# Patient Record
Sex: Male | Born: 1995 | Race: Black or African American | Hispanic: No | Marital: Single | Smoking: Current every day smoker
Health system: Southern US, Community
[De-identification: ages and names within clinical notes are randomized; demographics above are authoritative.]

## PROBLEM LIST (undated history)

## (undated) DIAGNOSIS — L509 Urticaria, unspecified: Secondary | ICD-10-CM

## (undated) DIAGNOSIS — L309 Dermatitis, unspecified: Secondary | ICD-10-CM

## (undated) DIAGNOSIS — J45909 Unspecified asthma, uncomplicated: Secondary | ICD-10-CM

## (undated) HISTORY — DX: Dermatitis, unspecified: L30.9

## (undated) HISTORY — DX: Urticaria, unspecified: L50.9

---
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
## 2013-11-02 ENCOUNTER — Emergency Department (HOSPITAL_COMMUNITY)
Admission: EM | Admit: 2013-11-02 | Discharge: 2013-11-03 | Disposition: A | Discharge status: Home / Self Care | Payer: Medicaid Other | Attending: Emergency Medicine | Admitting: Emergency Medicine

## 2013-11-02 ENCOUNTER — Encounter (HOSPITAL_COMMUNITY): Payer: Self-pay | Admitting: Emergency Medicine

## 2013-11-02 ENCOUNTER — Emergency Department (HOSPITAL_COMMUNITY): Payer: Medicaid Other

## 2013-11-02 DIAGNOSIS — R079 Chest pain, unspecified: Secondary | ICD-10-CM | POA: Insufficient documentation

## 2013-11-02 DIAGNOSIS — M546 Pain in thoracic spine: Secondary | ICD-10-CM | POA: Diagnosis not present

## 2013-11-02 DIAGNOSIS — R059 Cough, unspecified: Secondary | ICD-10-CM | POA: Diagnosis not present

## 2013-11-02 DIAGNOSIS — F17200 Nicotine dependence, unspecified, uncomplicated: Secondary | ICD-10-CM | POA: Diagnosis not present

## 2013-11-02 DIAGNOSIS — Z880 Allergy status to penicillin: Secondary | ICD-10-CM | POA: Diagnosis not present

## 2013-11-02 DIAGNOSIS — R05 Cough: Secondary | ICD-10-CM | POA: Insufficient documentation

## 2013-11-02 DIAGNOSIS — R110 Nausea: Secondary | ICD-10-CM | POA: Insufficient documentation

## 2013-11-02 LAB — BASIC METABOLIC PANEL
Anion gap: 13 (ref 5–15)
BUN: 16 mg/dL (ref 6–23)
CO2: 27 mEq/L (ref 19–32)
Calcium: 9.9 mg/dL (ref 8.4–10.5)
Chloride: 103 mEq/L (ref 96–112)
Creatinine, Ser: 1.19 mg/dL (ref 0.50–1.35)
GFR calc Af Amer: >90 mL/min (ref >90)
GFR calc non Af Amer: 87 mL/min — ABNORMAL LOW (ref >90)
Glucose, Bld: 97 mg/dL (ref 70–99)
Potassium: 4.3 mEq/L (ref 3.7–5.3)
Sodium: 143 mEq/L (ref 137–147)

## 2013-11-02 LAB — CBC
HCT: 42.9 % (ref 39.0–52.0)
Hemoglobin: 15 g/dL (ref 13.0–17.0)
MCH: 29.5 pg (ref 26.0–34.0)
MCHC: 35 g/dL (ref 30.0–36.0)
MCV: 84.3 fL (ref 78.0–100.0)
Platelets: 124 10*3/uL — ABNORMAL LOW (ref 150–400)
RBC: 5.09 MIL/uL (ref 4.22–5.81)
RDW: 13.5 % (ref 11.5–15.5)
WBC: 4.5 10*3/uL (ref 4.0–10.5)

## 2013-11-02 IMAGING — CR DG CHEST 2V
2 series · 2 of 2 positions shown · non-contrast
Comparison: Chest radiograph performed [DATE]

CLINICAL DATA: Left-sided chest pain and shortness of breath. Chest
pain radiates to the mid chest. History of smoking.

EXAM:
CHEST  2 VIEW

[w chest pa]
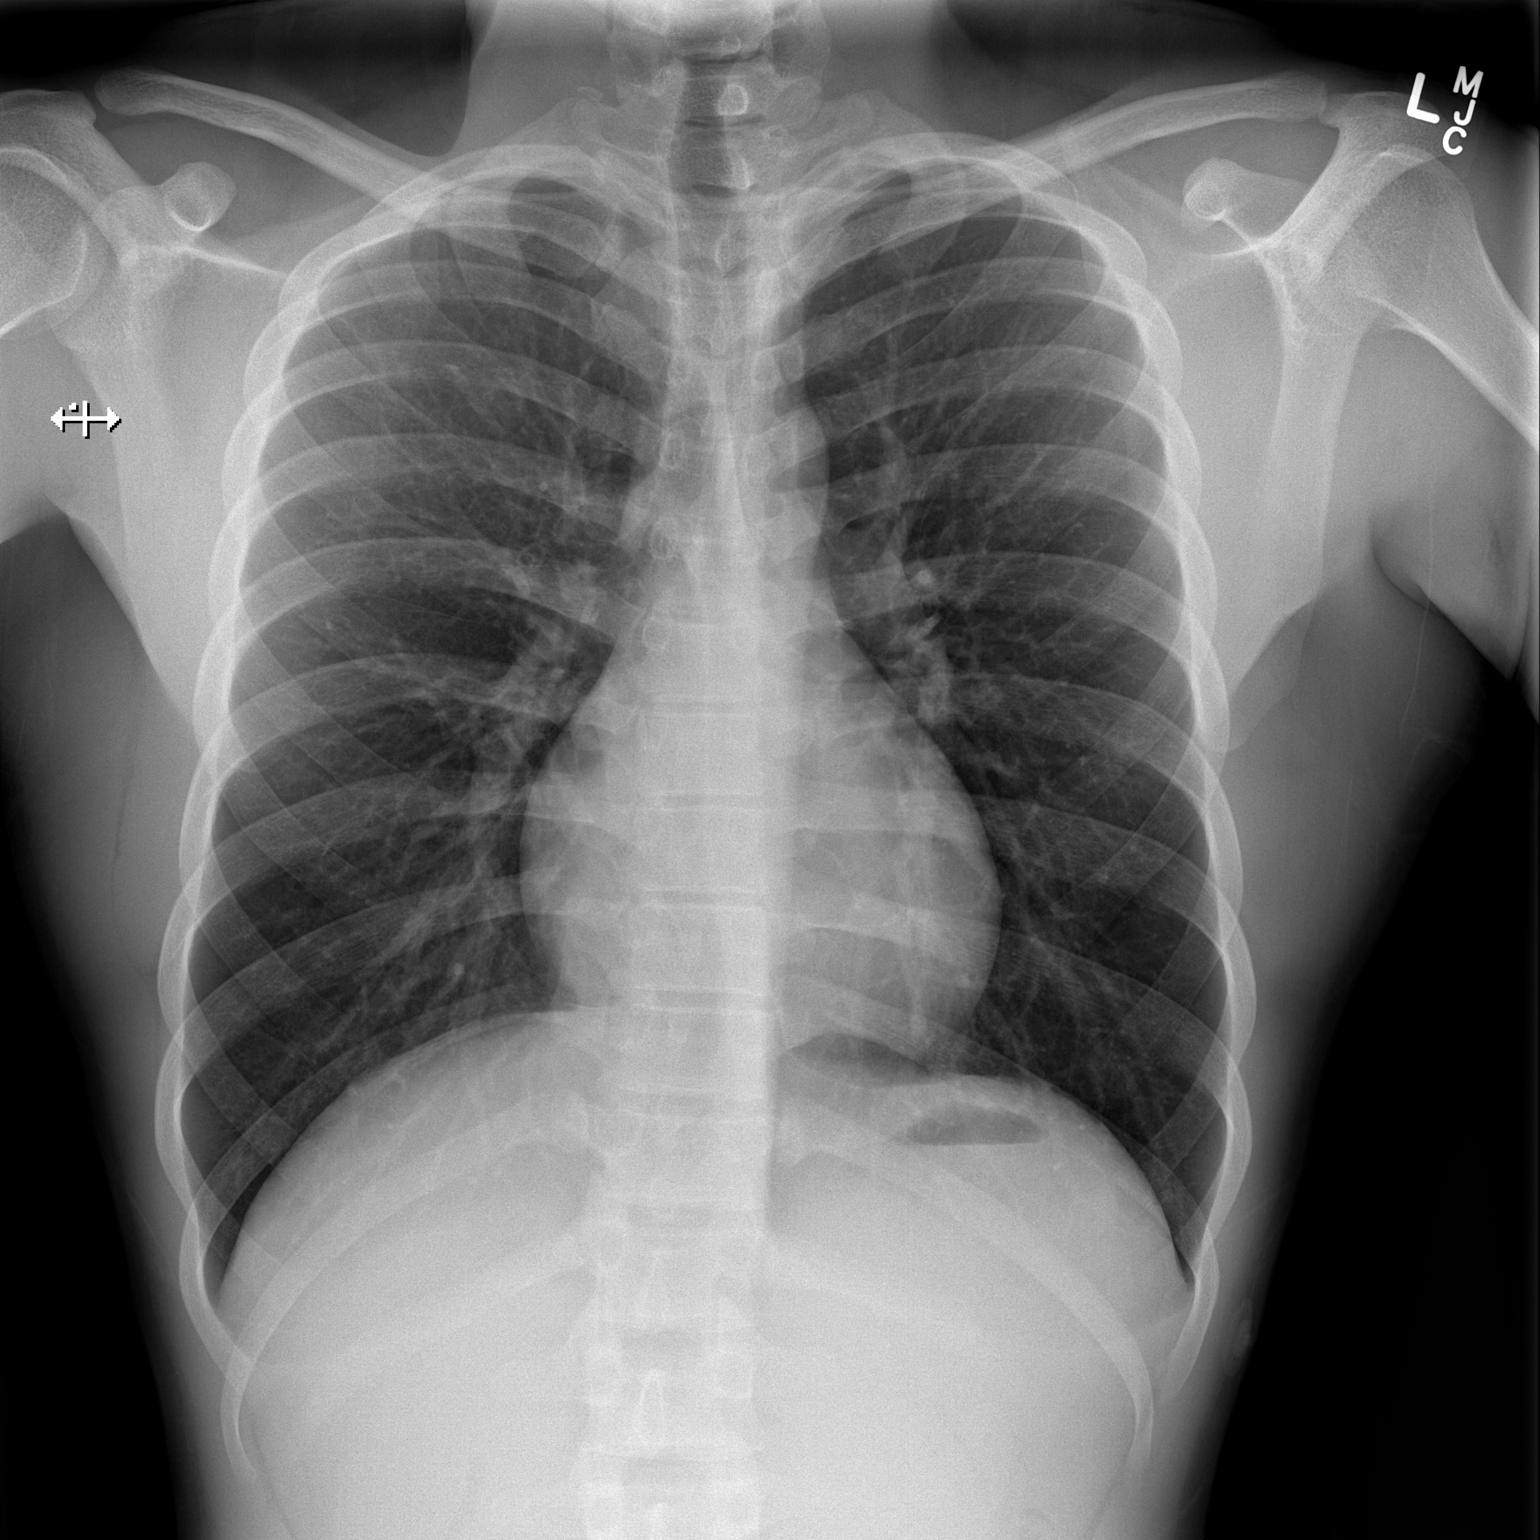

[w chest lat]
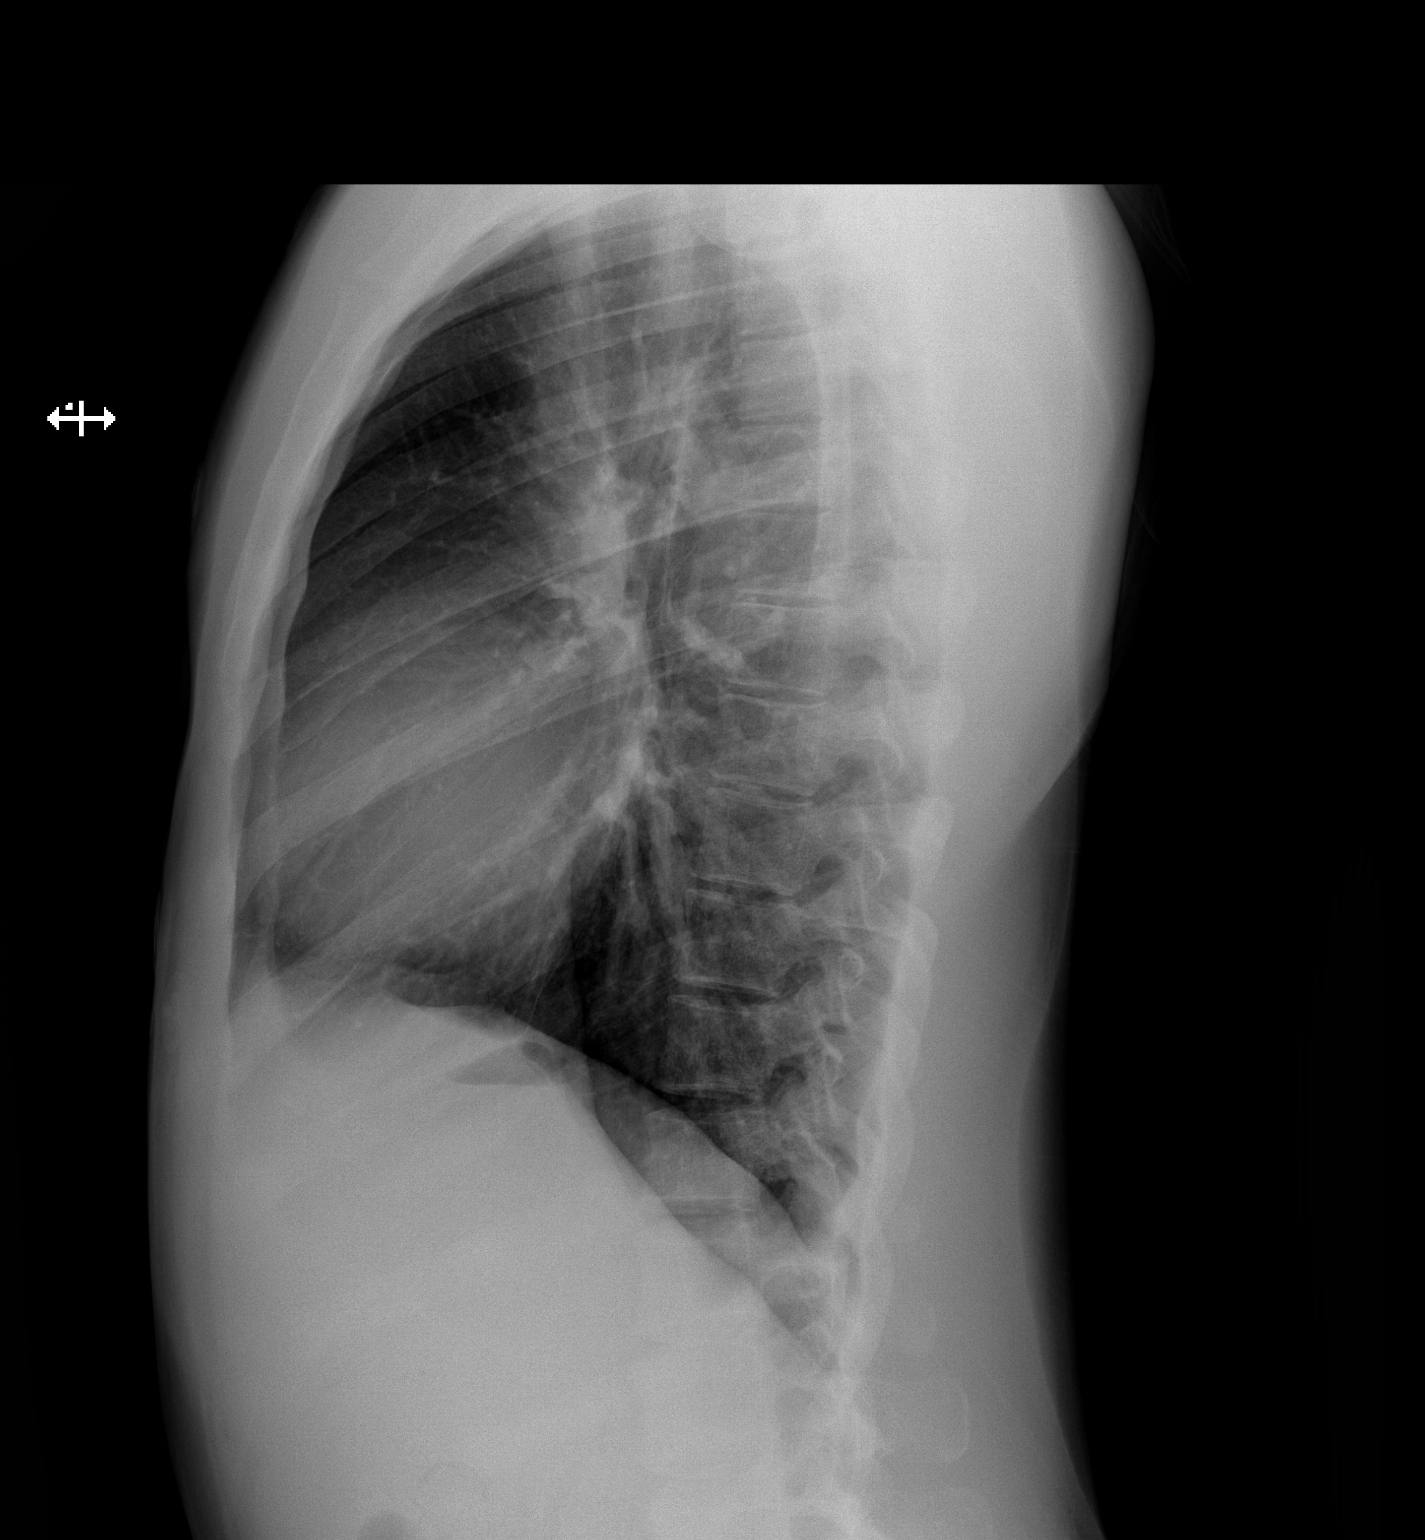

[2 of 2 positions shown; findings below may reference images not displayed]

FINDINGS: The lungs are well-aerated and clear. There is no evidence of focal
opacification, pleural effusion or pneumothorax.

The heart is normal in size; the mediastinal contour is within
normal limits. No acute osseous abnormalities are seen.
IMPRESSION: No acute cardiopulmonary process seen.

## 2013-11-02 MED ORDER — DM-GUAIFENESIN ER 30-600 MG PO TB12
1.0000 | ORAL_TABLET | Freq: Two times a day (BID) | ORAL | Status: DC
  Administered 2013-11-02: 1 via ORAL
  Filled 2013-11-02: qty 1

## 2013-11-02 MED ORDER — ACETAMINOPHEN-CODEINE #3 300-30 MG PO TABS
1.0000 | ORAL_TABLET | Freq: Once | ORAL | Status: AC
Start: 2013-11-02 — End: 2013-11-02
  Administered 2013-11-02: 1 via ORAL
  Filled 2013-11-02: qty 1

## 2013-11-02 NOTE — ED Notes (Signed)
Pt states he has been having left sided chest pain and SOB since Thursday, Pt denies any dizziness or diaphoresis but states he has had n/v.

## 2013-11-02 NOTE — ED Provider Notes (Signed)
CSN: [id number]     Arrival date & time 11/02/13  2154 History   First MD Initiated Contact with Patient 11/02/13 2259     Chief Complaint  Patient presents with  . Shortness of Breath  . Chest Pain     (Consider location/radiation/quality/duration/timing/severity/associated sxs/prior Treatment) HPI Philip Ramos is a 18 y.o. male with no significant past medical history coming in with chest pain for the past 4 days. Patient actually points to his posterior back on the left side. He states it's worse at rest and associated with shortness of breath. He denies any recent trauma, heavy lifting, or reasons for soreness. He has had a viral URI recently. He has persistent cough with congestion. He had one episode of posttussive emesis that was nonbloody. He denies any fevers or diaphoresis. He denies any sick contacts. He has no risk factors for blood clots nor does he have a history of them.  He describes no neuro deficits.    10 Systems reviewed and are negative for acute change except as noted in the HPI.     History reviewed. No pertinent past medical history. History reviewed. No pertinent past surgical history. No family history on file. History  Substance Use Topics  . Smoking status: Current Every Day Smoker  . Smokeless tobacco: Not on file     Comment: smokes black and milds  . Alcohol Use: No    Review of Systems    Allergies  Amoxicillin and Penicillins  Home Medications   Prior to Admission medications   Medication Sig Start Date End Date Taking? Authorizing Provider  ibuprofen (ADVIL,MOTRIN) 200 MG tablet Take 200 mg by mouth every 6 (six) hours as needed for moderate pain.   Yes Historical Provider, MD  Menthol, Topical Analgesic, (ICY HOT ADVANCED RELIEF EX) Apply 1 application topically 3 (three) times daily as needed (pain).   Yes Historical Provider, MD   BP 141/83  Pulse 97  Temp(Src) 97.8 F (36.6 C) (Oral)  Resp 20  Ht  (1.753 m)  Wt 161 lb  (73.029 kg)  BMI 23.76 kg/m2  SpO2 100% Physical Exam  Nursing note and vitals reviewed. Constitutional: He is oriented to person, place, and time. Vital signs are normal. He appears well-developed and well-nourished.  Non-toxic appearance. He does not appear ill. No distress.  HENT:  Head: Normocephalic and atraumatic.  Nose: Nose normal.  Mouth/Throat: Oropharynx is clear and moist. No oropharyngeal exudate.  Eyes: Conjunctivae and EOM are normal. Pupils are equal, round, and reactive to light. No scleral icterus.  Neck: Normal range of motion. Neck supple. No tracheal deviation, no edema, no erythema and normal range of motion present. No mass and no thyromegaly present.  Cardiovascular: Normal rate, regular rhythm, S1 normal, S2 normal, normal heart sounds, intact distal pulses and normal pulses.  Exam reveals no gallop and no friction rub.   No murmur heard. Pulses:      Radial pulses are 2+ on the right side, and 2+ on the left side.       Dorsalis pedis pulses are 2+ on the right side, and 2+ on the left side.  Pulmonary/Chest: Effort normal and breath sounds normal. No respiratory distress. He has no wheezes. He has no rhonchi. He has no rales. He exhibits no tenderness.  Abdominal: Soft. Normal appearance and bowel sounds are normal. He exhibits no distension, no ascites and no mass. There is no hepatosplenomegaly. There is no tenderness. There is no rebound, no guarding  and no CVA tenderness.  Musculoskeletal: Normal range of motion. He exhibits no edema and no tenderness.  Lymphadenopathy:    He has no cervical adenopathy.  Neurological: He is alert and oriented to person, place, and time. He has normal strength. No cranial nerve deficit or sensory deficit. GCS eye subscore is 4. GCS verbal subscore is 5. GCS motor subscore is 6.  Skin: Skin is warm, dry and intact. No petechiae and no rash noted. He is not diaphoretic. No erythema. No pallor.  Psychiatric: He has a normal mood and  affect. His behavior is normal. Judgment normal.    ED Course  Procedures (including critical care time) Labs Review Labs Reviewed  CBC - Abnormal; Notable for the following:    Platelets 124 (*)    All other components within normal limits  BASIC METABOLIC PANEL - Abnormal; Notable for the following:    GFR calc non Af Amer 87 (*)    All other components within normal limits  I-STAT TROPOININ, ED    Imaging Review Dg Chest 2 View  11/02/2013   CLINICAL DATA:  Left-sided chest pain and shortness of breath. Chest pain radiates to the mid chest. History of smoking.  EXAM: CHEST  2 VIEW  COMPARISON:  Chest radiograph performed 01/15/2009  FINDINGS: The lungs are well-aerated and clear. There is no evidence of focal opacification, pleural effusion or pneumothorax.  The heart is normal in size; the mediastinal contour is within normal limits. No acute osseous abnormalities are seen.  IMPRESSION: No acute cardiopulmonary process seen.   Electronically Signed   By: Roanna Raider M.D.   On: 11/02/2013 23:32     EKG Interpretation   Date/Time:  Sunday November 02 2013 22:11:10 EDT Ventricular Rate:  83 PR Interval:  163 QRS Duration: 93 QT Interval:  342 QTC Calculation: 402 R Axis:   81 Text Interpretation:  Sinus rhythm RAE, consider biatrial enlargement No  significant change since last tracing Confirmed by KNAPP  MD-J, JON  ([id number]) on 11/02/2013 10:18:14 PM      MDM   Final diagnoses:  None    This is a 18 year old male coming in complaining of chest pain the setting of viral URI. He states he has had a cough during the interval.  A low concern for ACS as the patient is tone, there is no exertional component, and denies any shortness of breath. EKG does not reveal any significant abnormalities. Patient has perked negative. He took a Motrin at home with no relief. He was given Mucinex and a Tylenol 3 in the emergency department for his congestion and cough.  Upon repeat  assessment patient states his pain is significantly improved, and the cough is decreased. Patient be sent home with a prescription for Tylenol 3 and advised to followup with regular physician within this week. Return precautions were given. Vital signs remain within his normal limits, patient safe for discharge.  Tomasita Crumble, MD 11/03/13 [phone]

## 2013-11-03 MED ORDER — ACETAMINOPHEN-CODEINE #3 300-30 MG PO TABS: 1.0000 | ORAL_TABLET | Freq: Two times a day (BID) | ORAL | Status: DC | PRN

## 2013-11-03 NOTE — Discharge Instructions (Signed)
Back Pain, Adult Philip Ramos, you were seen today for back pain and congestion.  Your labs and chest xray were normal.  Your EKG is unchanged from previous.  Follow up with your regular doctor within 3 days for continued pain.  Return to the ED immediately for any worsening concerns or persistent chest pain. Thank you. Back pain is very common. The pain often gets better over time. The cause of back pain is usually not dangerous. Most people can learn to manage their back pain on their own.  HOME CARE   Stay active. Start with short walks on flat ground if you can. Try to walk farther each day.  Do not sit, drive, or stand in one place for more than 30 minutes. Do not stay in bed.  Do not avoid exercise or work. Activity can help your back heal faster.  Be careful when you bend or lift an object. Bend at your knees, keep the object close to you, and do not twist.  Sleep on a firm mattress. Lie on your side, and bend your knees. If you lie on your back, put a pillow under your knees.  Only take medicines as told by your doctor.  Put ice on the injured area.  Put ice in a plastic bag.  Place a towel between your skin and the bag.  Leave the ice on for 15-20 minutes, 03-04 times a day for the first 2 to 3 days. After that, you can switch between ice and heat packs.  Ask your doctor about back exercises or massage.  Avoid feeling anxious or stressed. Find good ways to deal with stress, such as exercise. GET HELP RIGHT AWAY IF:   Your pain does not go away with rest or medicine.  Your pain does not go away in 1 week.  You have new problems.  You do not feel well.  The pain spreads into your legs.  You cannot control when you poop (bowel movement) or pee (urinate).  Your arms or legs feel weak or lose feeling (numbness).  You feel sick to your stomach (nauseous) or throw up (vomit).  You have belly (abdominal) pain.  You feel like you may pass out (faint). MAKE SURE YOU:    Understand these instructions.  Will watch your condition.  Will get help right away if you are not doing well or get worse. Document Released: 07/26/2007 Document Revised: 05/01/2011 Document Reviewed: 06/10/2013 [facility] Patient Information 2015 [place], [place]. This information is not intended to replace advice given to you by your health care provider. Make sure you discuss any questions you have with your health care provider.

## 2013-11-10 ENCOUNTER — Encounter (HOSPITAL_COMMUNITY): Payer: Self-pay | Admitting: Emergency Medicine

## 2013-11-10 ENCOUNTER — Emergency Department (HOSPITAL_COMMUNITY)
Admission: EM | Admit: 2013-11-10 | Discharge: 2013-11-10 | Discharge status: Against Medical Advice | Payer: Medicaid Other | Attending: Emergency Medicine | Admitting: Emergency Medicine

## 2013-11-10 DIAGNOSIS — J029 Acute pharyngitis, unspecified: Secondary | ICD-10-CM | POA: Diagnosis present

## 2013-11-10 DIAGNOSIS — F17200 Nicotine dependence, unspecified, uncomplicated: Secondary | ICD-10-CM | POA: Insufficient documentation

## 2013-11-10 DIAGNOSIS — M25519 Pain in unspecified shoulder: Secondary | ICD-10-CM | POA: Diagnosis not present

## 2013-11-10 NOTE — ED Notes (Signed)
Pt not in room unable to locate pt in room, triage, in the lobby or the area immediately outside

## 2013-11-10 NOTE — ED Notes (Signed)
PT not in room, unable to locate pt in triage, lobby or area immediately outside

## 2013-11-10 NOTE — ED Notes (Signed)
Pt shown where the BR is; pt ambulatory without difficulty

## 2013-11-10 NOTE — ED Notes (Signed)
3rd attempt to locate pt without success; pt eloped from triage

## 2013-11-10 NOTE — ED Notes (Signed)
Pt states that he feels like there is fluid in is throat and that he constantly has to clear his throat; pt states that he was seen last week for left shoulder / under arm pain; pt states that the X-rays were negative and that he was given some medicine but he still has pain and swelling to under left arm

## 2013-11-17 ENCOUNTER — Other Ambulatory Visit (HOSPITAL_COMMUNITY): Payer: Self-pay | Admitting: Internal Medicine

## 2013-11-17 DIAGNOSIS — K3189 Other diseases of stomach and duodenum: Secondary | ICD-10-CM

## 2013-11-17 DIAGNOSIS — R1013 Epigastric pain: Principal | ICD-10-CM

## 2013-11-26 ENCOUNTER — Ambulatory Visit (HOSPITAL_COMMUNITY)
Admission: RE | Admit: 2013-11-26 | Discharge: 2013-11-26 | Disposition: A | Discharge status: Home / Self Care | Payer: Medicaid Other | Source: Ambulatory Visit | Attending: Internal Medicine | Admitting: Internal Medicine

## 2013-11-26 DIAGNOSIS — K319 Disease of stomach and duodenum, unspecified: Secondary | ICD-10-CM

## 2013-11-26 DIAGNOSIS — R1013 Epigastric pain: Secondary | ICD-10-CM | POA: Diagnosis present

## 2013-11-26 IMAGING — US US ABDOMEN COMPLETE
1 series · 14 of 25 positions shown · non-contrast
Comparison: None.

CLINICAL DATA: Dyspepsia and disorder of function of stomach.

EXAM:
ULTRASOUND ABDOMEN COMPLETE

[Series 1: us abdomen complete · 14 of 67 slices shown]
[im 1/67]
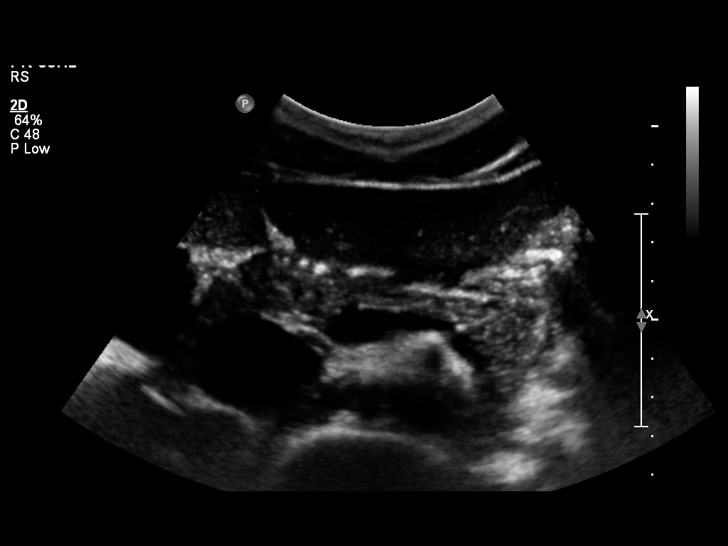
[im 6/67]
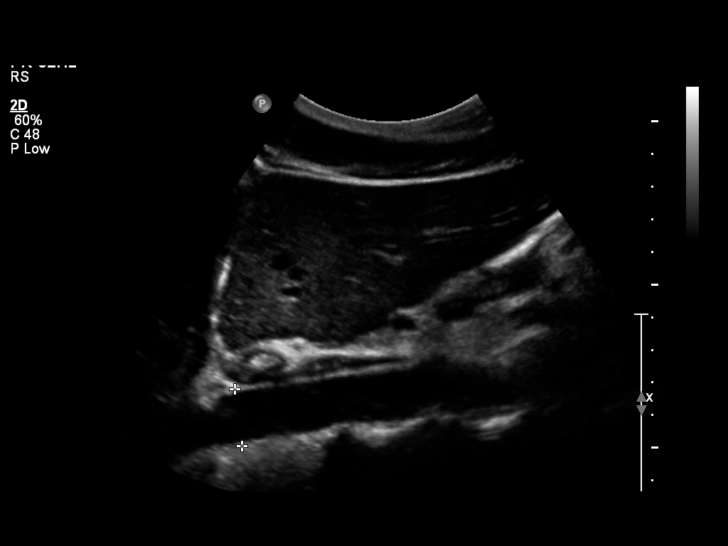
[im 12/67]
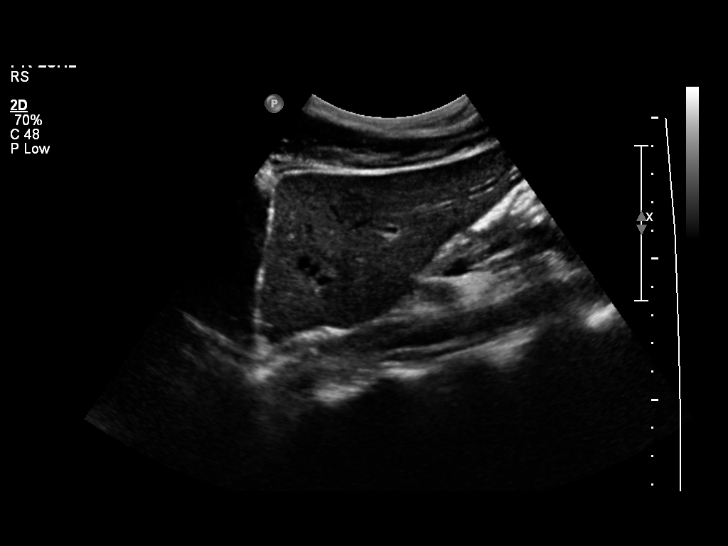
[im 17/67]
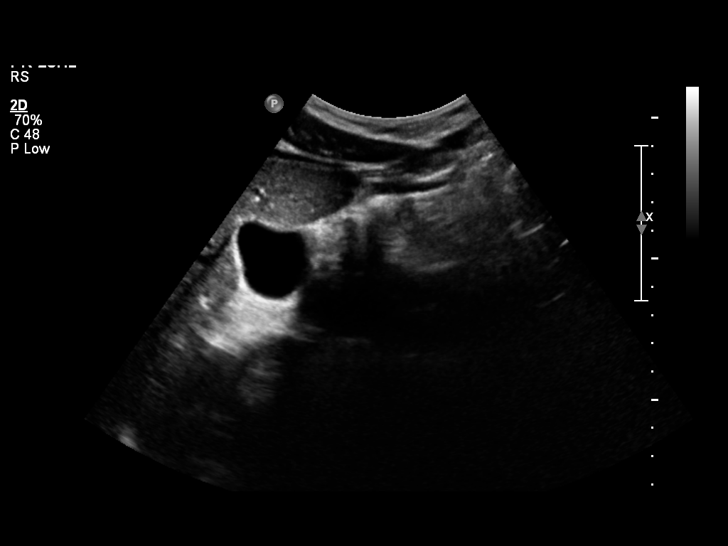
[im 23/67]
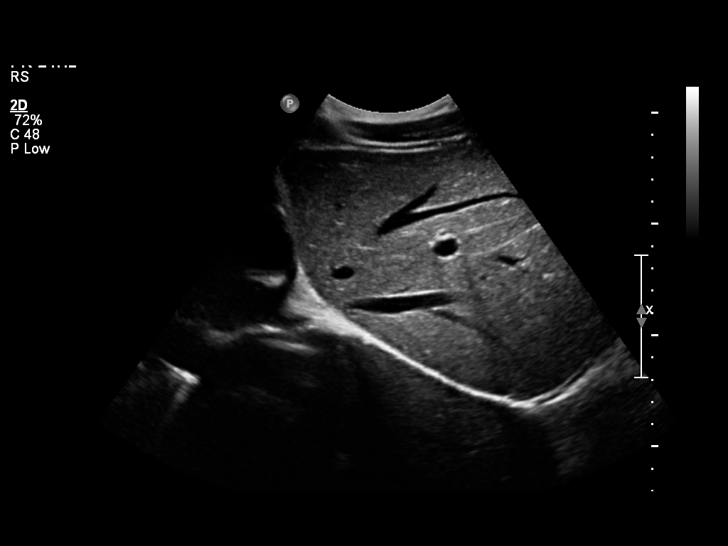
[im 25/67]
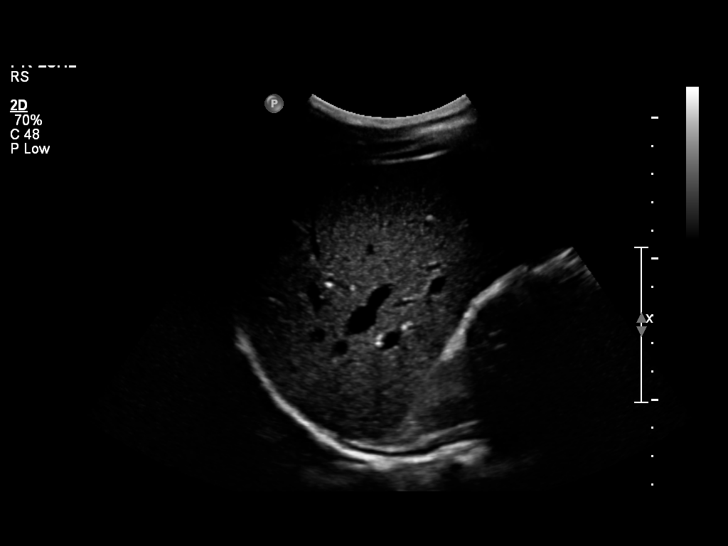
[im 31/67]
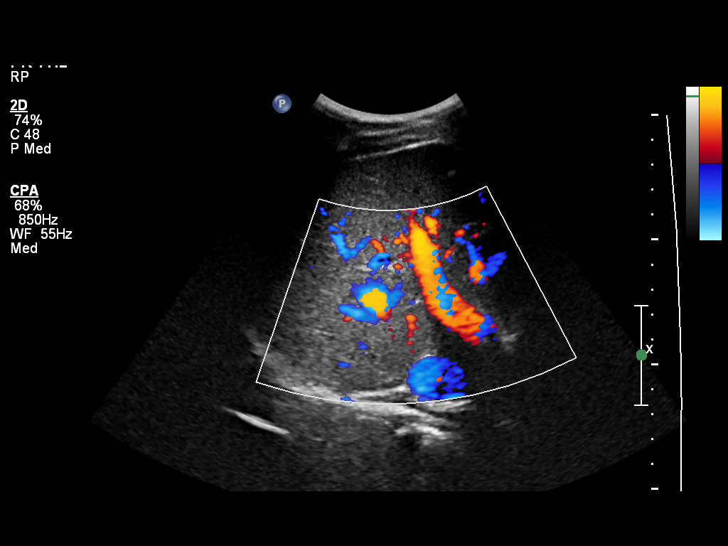
[im 36/67]
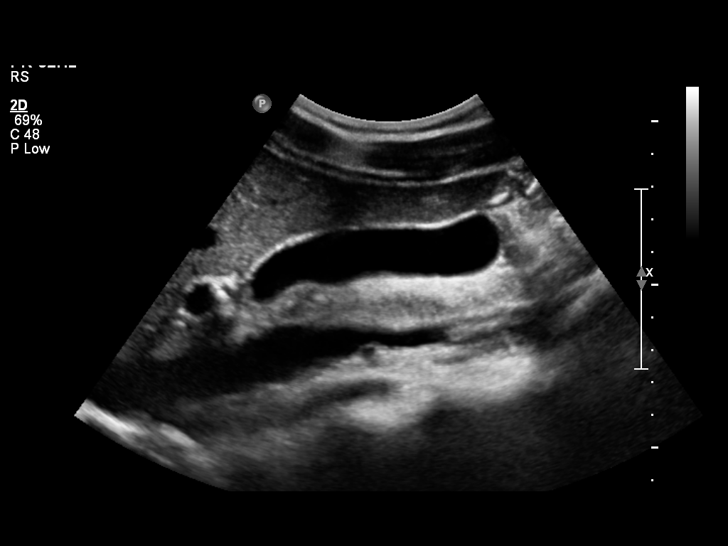
[im 42/67]
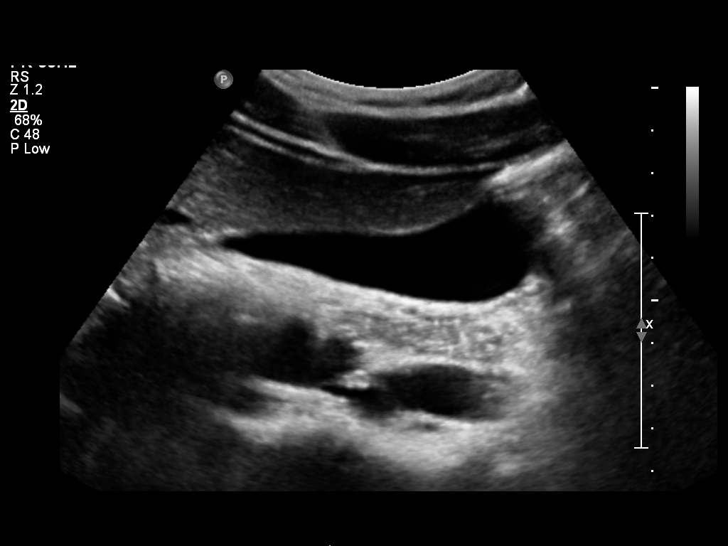
[im 45/67]
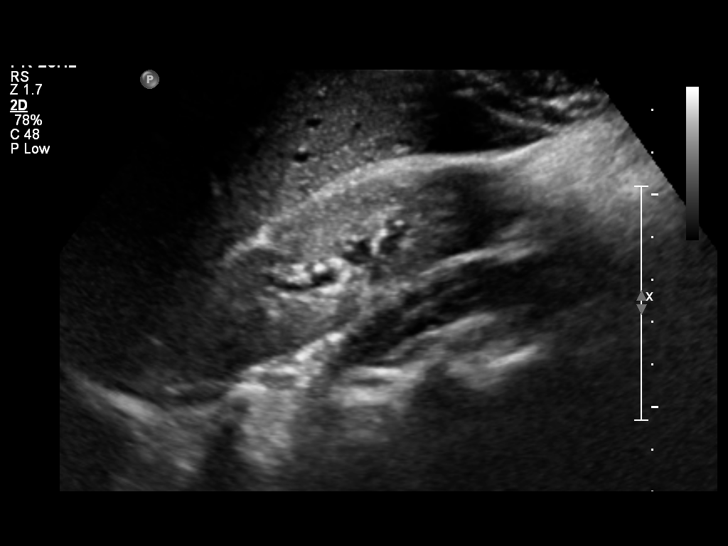
[im 50/67]
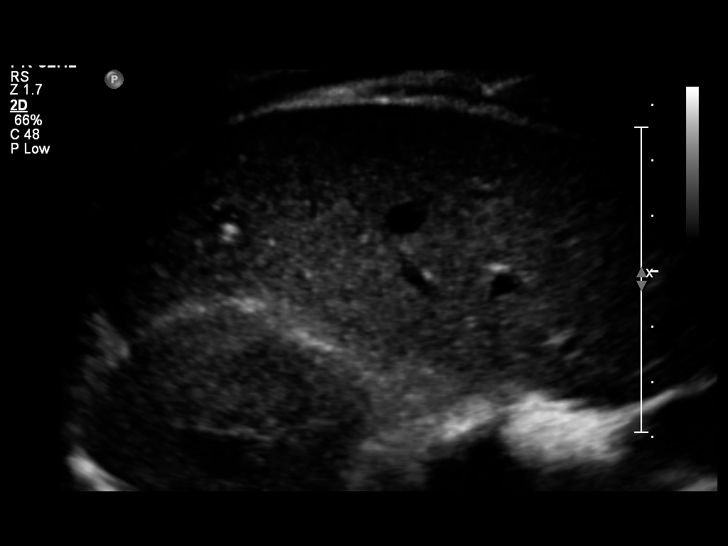
[im 56/67]
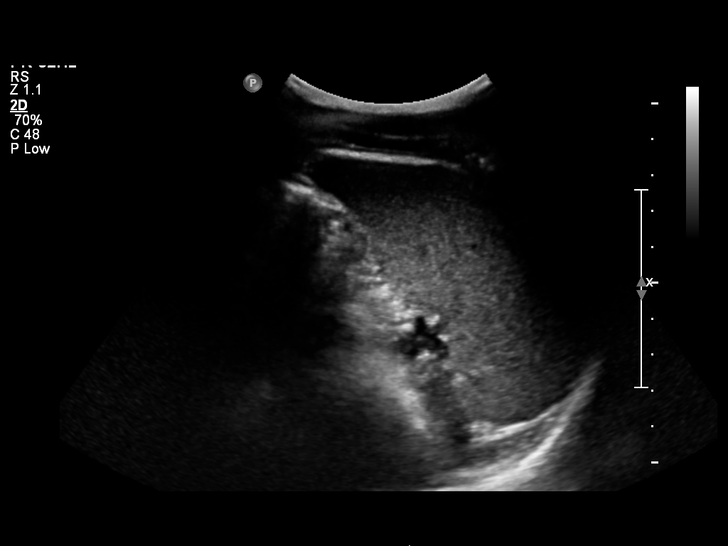
[im 61/67]
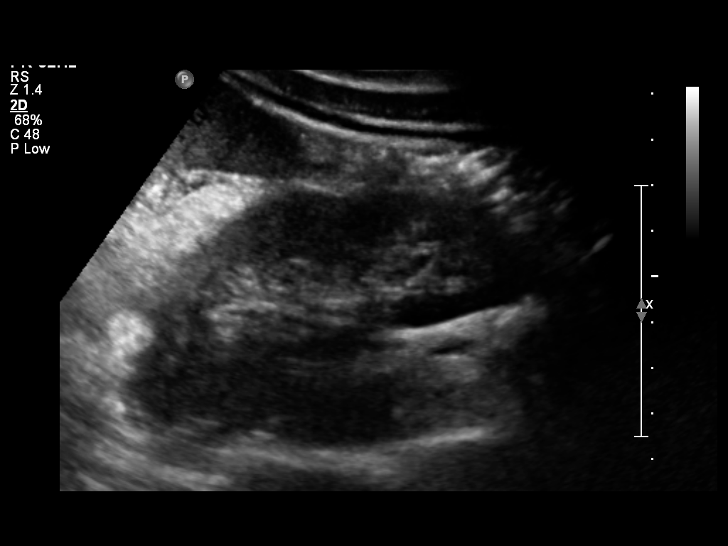
[im 67/67]
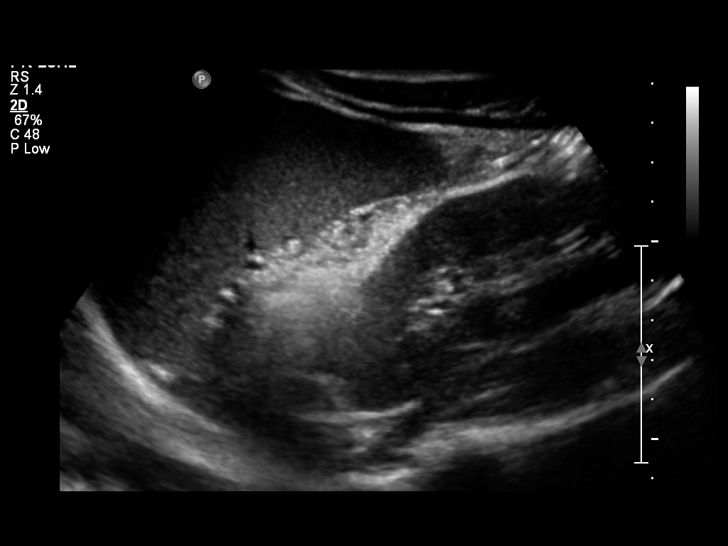

[14 of 25 positions shown; findings below may reference images not displayed]

FINDINGS: Gallbladder: No gallstones or wall thickening visualized. No
sonographic Murphy sign noted.

Common bile duct: Diameter: 2 mm which is within normal limits.

Liver: No focal lesion identified. Within normal limits in
parenchymal echogenicity.

IVC: No abnormality visualized.

Pancreas: Visualized portion unremarkable.

Spleen: Size and appearance within normal limits.

Right Kidney: Length: 10.1 cm. Echogenicity within normal limits. No
mass or hydronephrosis visualized.

Left Kidney: Length: 10.1 cm. Echogenicity within normal limits. No
mass or hydronephrosis visualized.

Abdominal aorta: No aneurysm visualized.

Other findings: None.
IMPRESSION: No significant abnormality seen in the abdomen.

## 2014-01-14 ENCOUNTER — Emergency Department (HOSPITAL_COMMUNITY): Payer: Medicaid Other

## 2014-01-14 ENCOUNTER — Emergency Department (HOSPITAL_COMMUNITY)
Admission: EM | Admit: 2014-01-14 | Discharge: 2014-01-14 | Disposition: A | Discharge status: Home / Self Care | Payer: Medicaid Other | Attending: Emergency Medicine | Admitting: Emergency Medicine

## 2014-01-14 ENCOUNTER — Encounter (HOSPITAL_COMMUNITY): Payer: Self-pay | Admitting: Emergency Medicine

## 2014-01-14 DIAGNOSIS — J069 Acute upper respiratory infection, unspecified: Secondary | ICD-10-CM | POA: Diagnosis not present

## 2014-01-14 DIAGNOSIS — Z79899 Other long term (current) drug therapy: Secondary | ICD-10-CM | POA: Diagnosis not present

## 2014-01-14 DIAGNOSIS — Z880 Allergy status to penicillin: Secondary | ICD-10-CM | POA: Insufficient documentation

## 2014-01-14 DIAGNOSIS — R059 Cough, unspecified: Secondary | ICD-10-CM

## 2014-01-14 DIAGNOSIS — R112 Nausea with vomiting, unspecified: Secondary | ICD-10-CM | POA: Diagnosis not present

## 2014-01-14 DIAGNOSIS — Z720 Tobacco use: Secondary | ICD-10-CM | POA: Diagnosis not present

## 2014-01-14 DIAGNOSIS — H9209 Otalgia, unspecified ear: Secondary | ICD-10-CM | POA: Insufficient documentation

## 2014-01-14 DIAGNOSIS — R05 Cough: Secondary | ICD-10-CM

## 2014-01-14 DIAGNOSIS — R0981 Nasal congestion: Secondary | ICD-10-CM | POA: Diagnosis present

## 2014-01-14 LAB — RAPID STREP SCREEN (MED CTR MEBANE ONLY): Streptococcus, Group A Screen (Direct): NEGATIVE

## 2014-01-14 IMAGING — CR DG CHEST 2V
2 series · 2 of 2 positions shown · non-contrast
Comparison: PA and lateral chest [DATE].

CLINICAL DATA: Cough and throat swelling for 1 week.

EXAM:
CHEST  2 VIEW

[w chest pa]
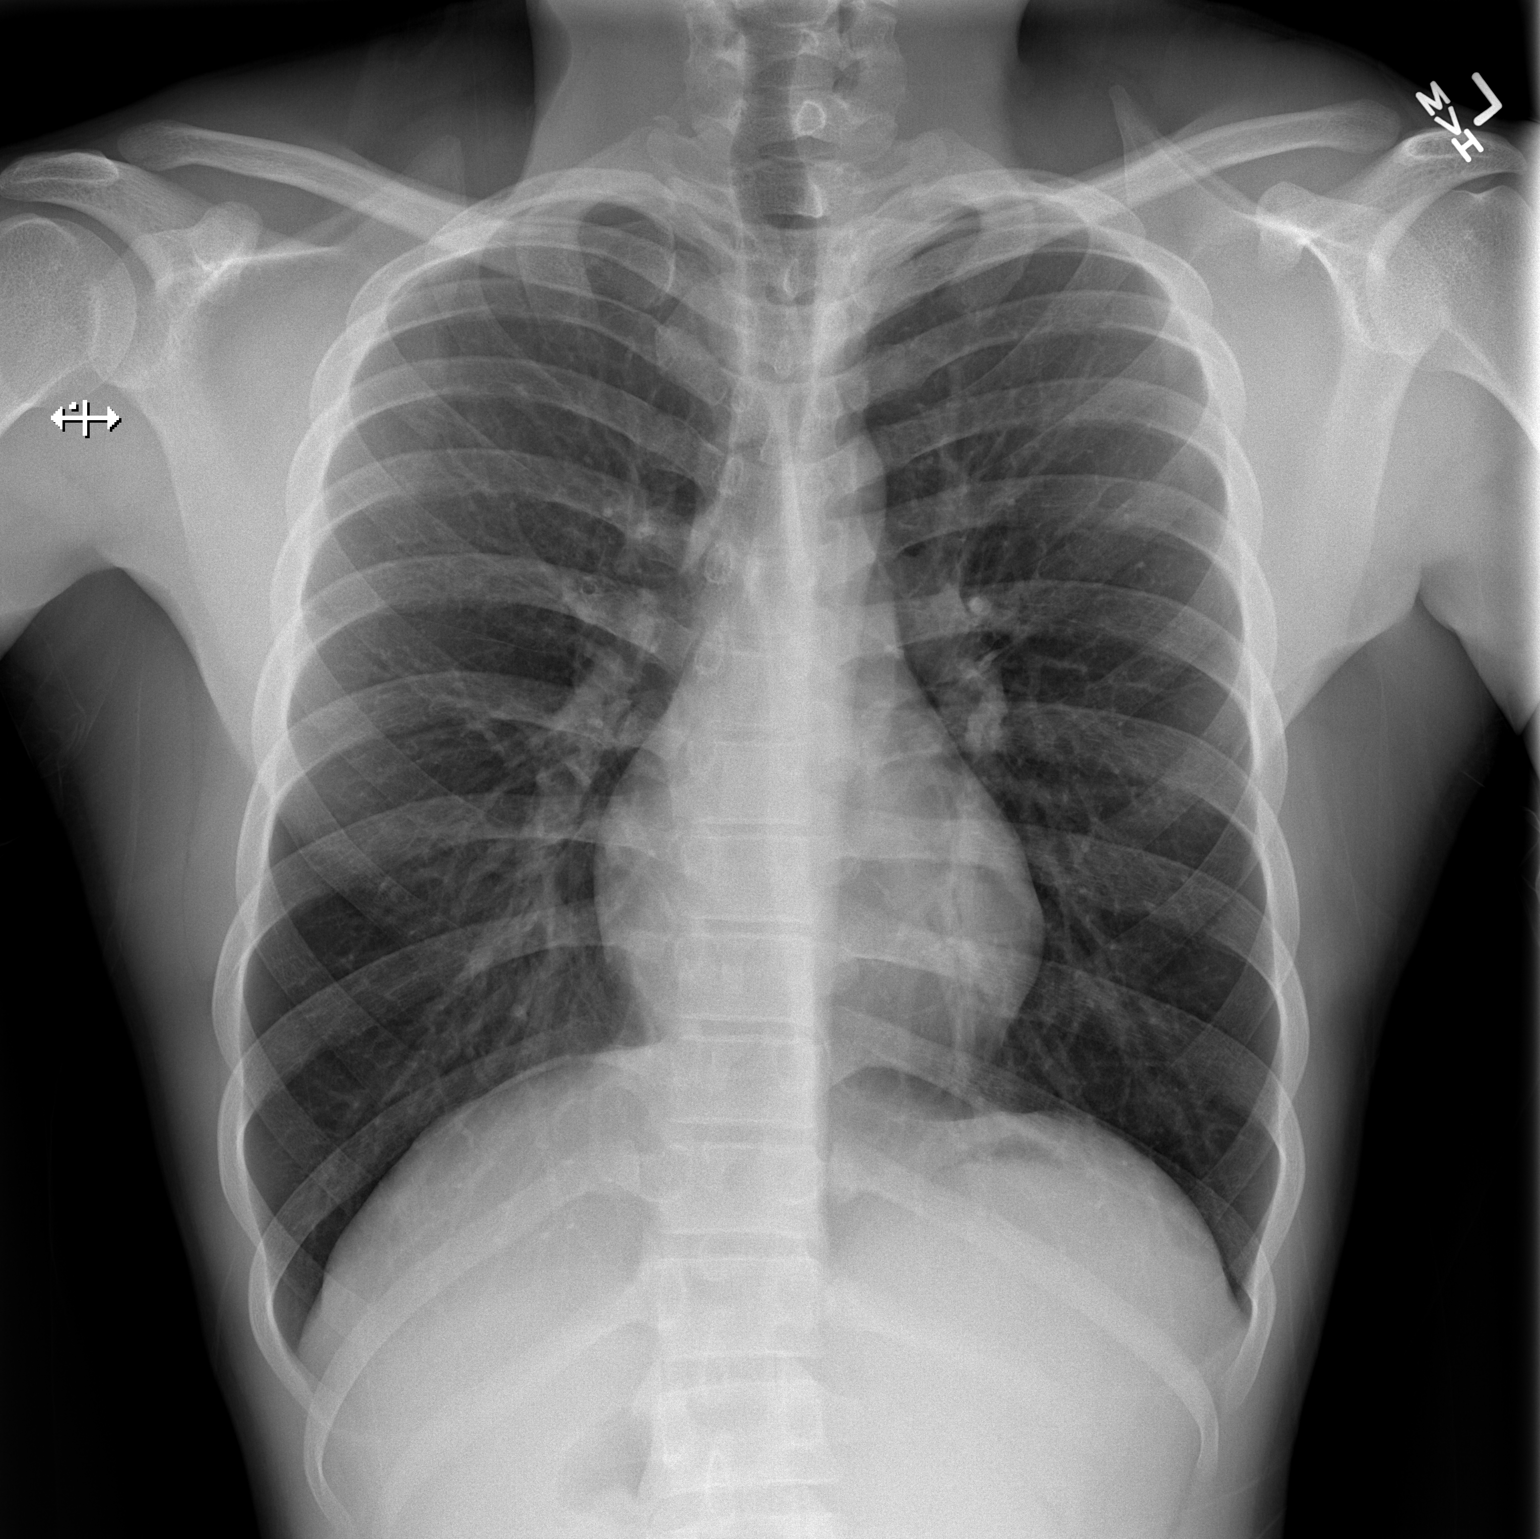

[w chest lat]
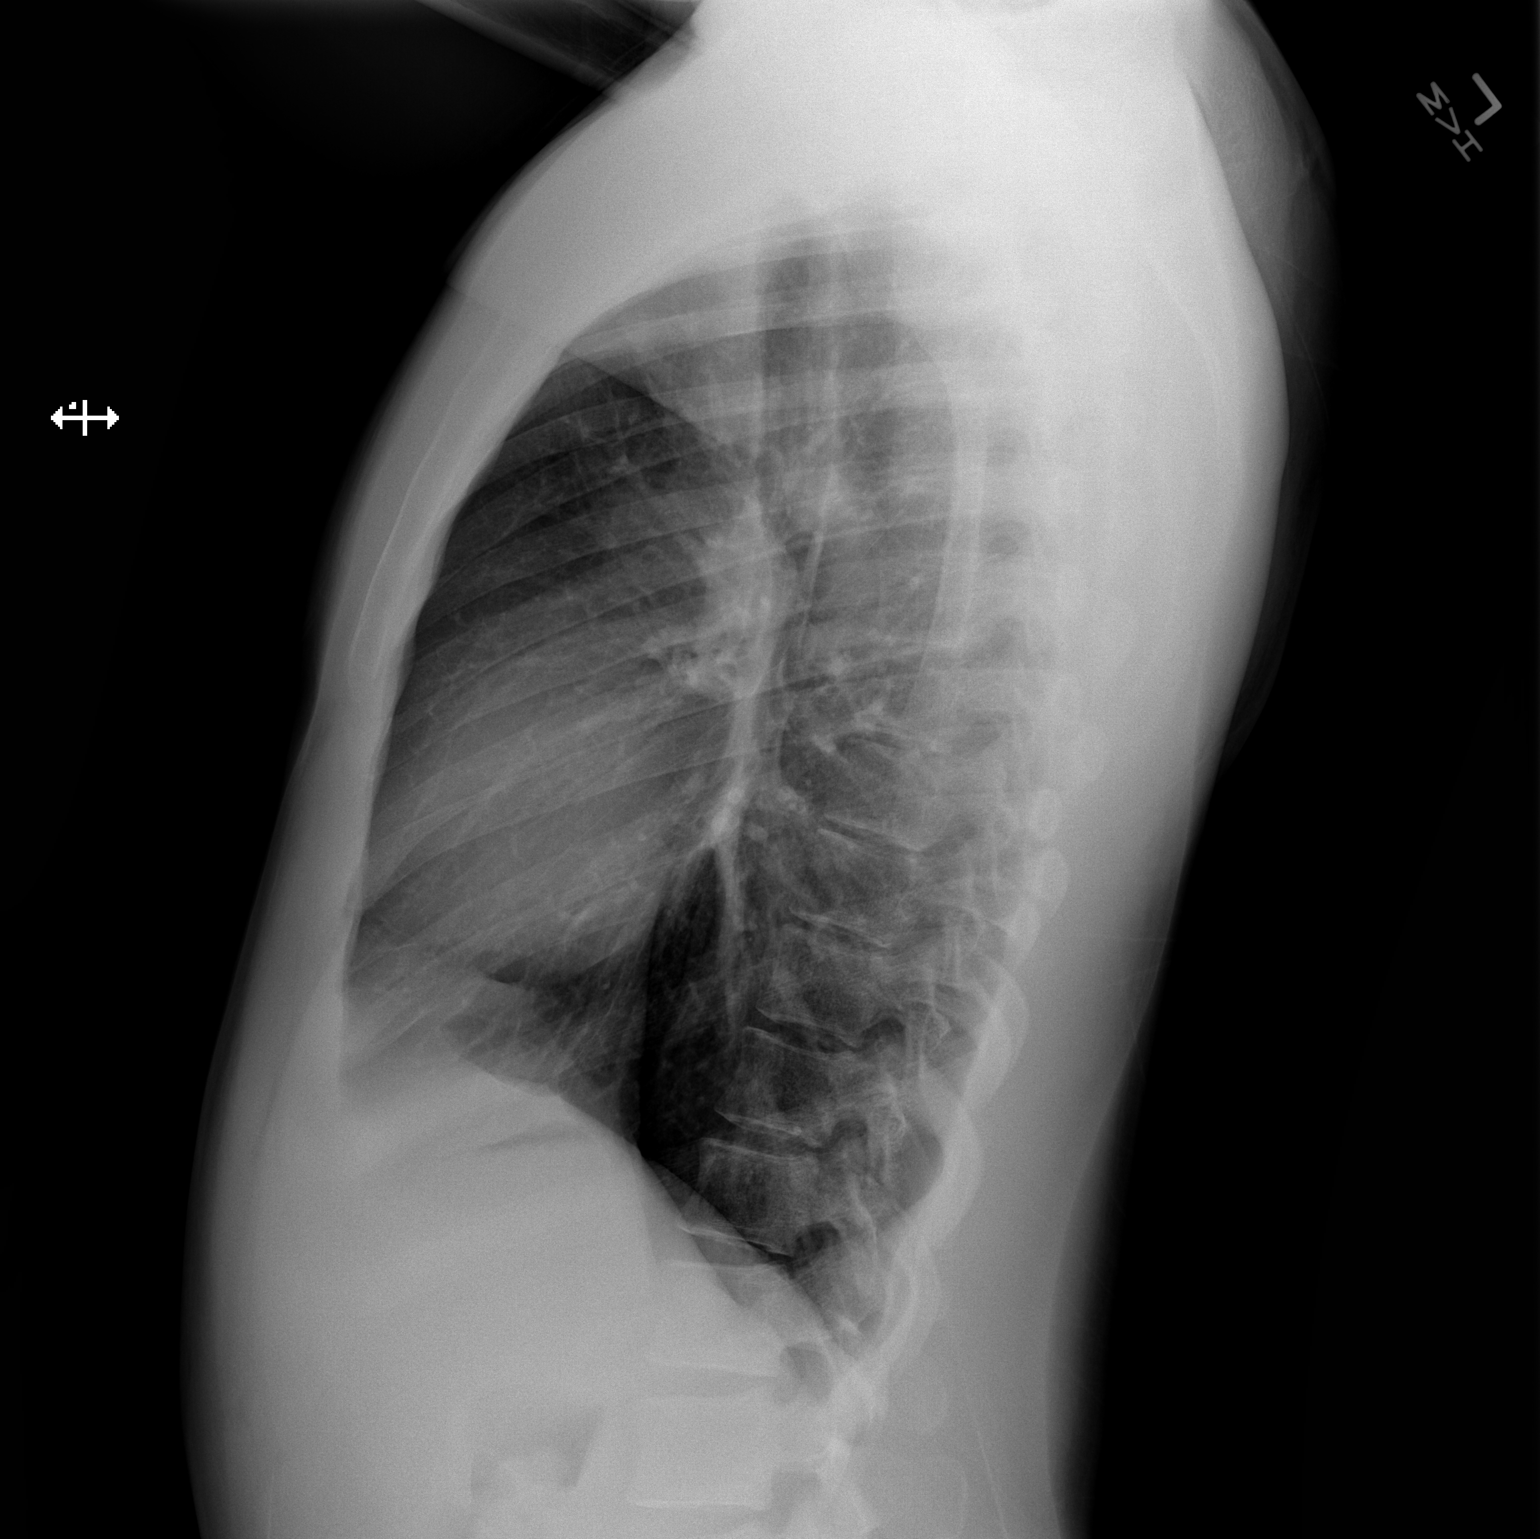

[2 of 2 positions shown; findings below may reference images not displayed]

FINDINGS: Heart size and mediastinal contours are within normal limits. Both
lungs are clear. Visualized skeletal structures are unremarkable.
IMPRESSION: Negative exam.

## 2014-01-14 IMAGING — CR DG NECK SOFT TISSUE
2 series · 2 of 2 positions shown · non-contrast
Comparison: Radiograph [DATE]

CLINICAL DATA: Pt states increasing feeling of his throat swelling
x 1 week - coughing/throat clearing

EXAM:
NECK SOFT TISSUES - 1+ VIEW

[w soft tissue neck ap]
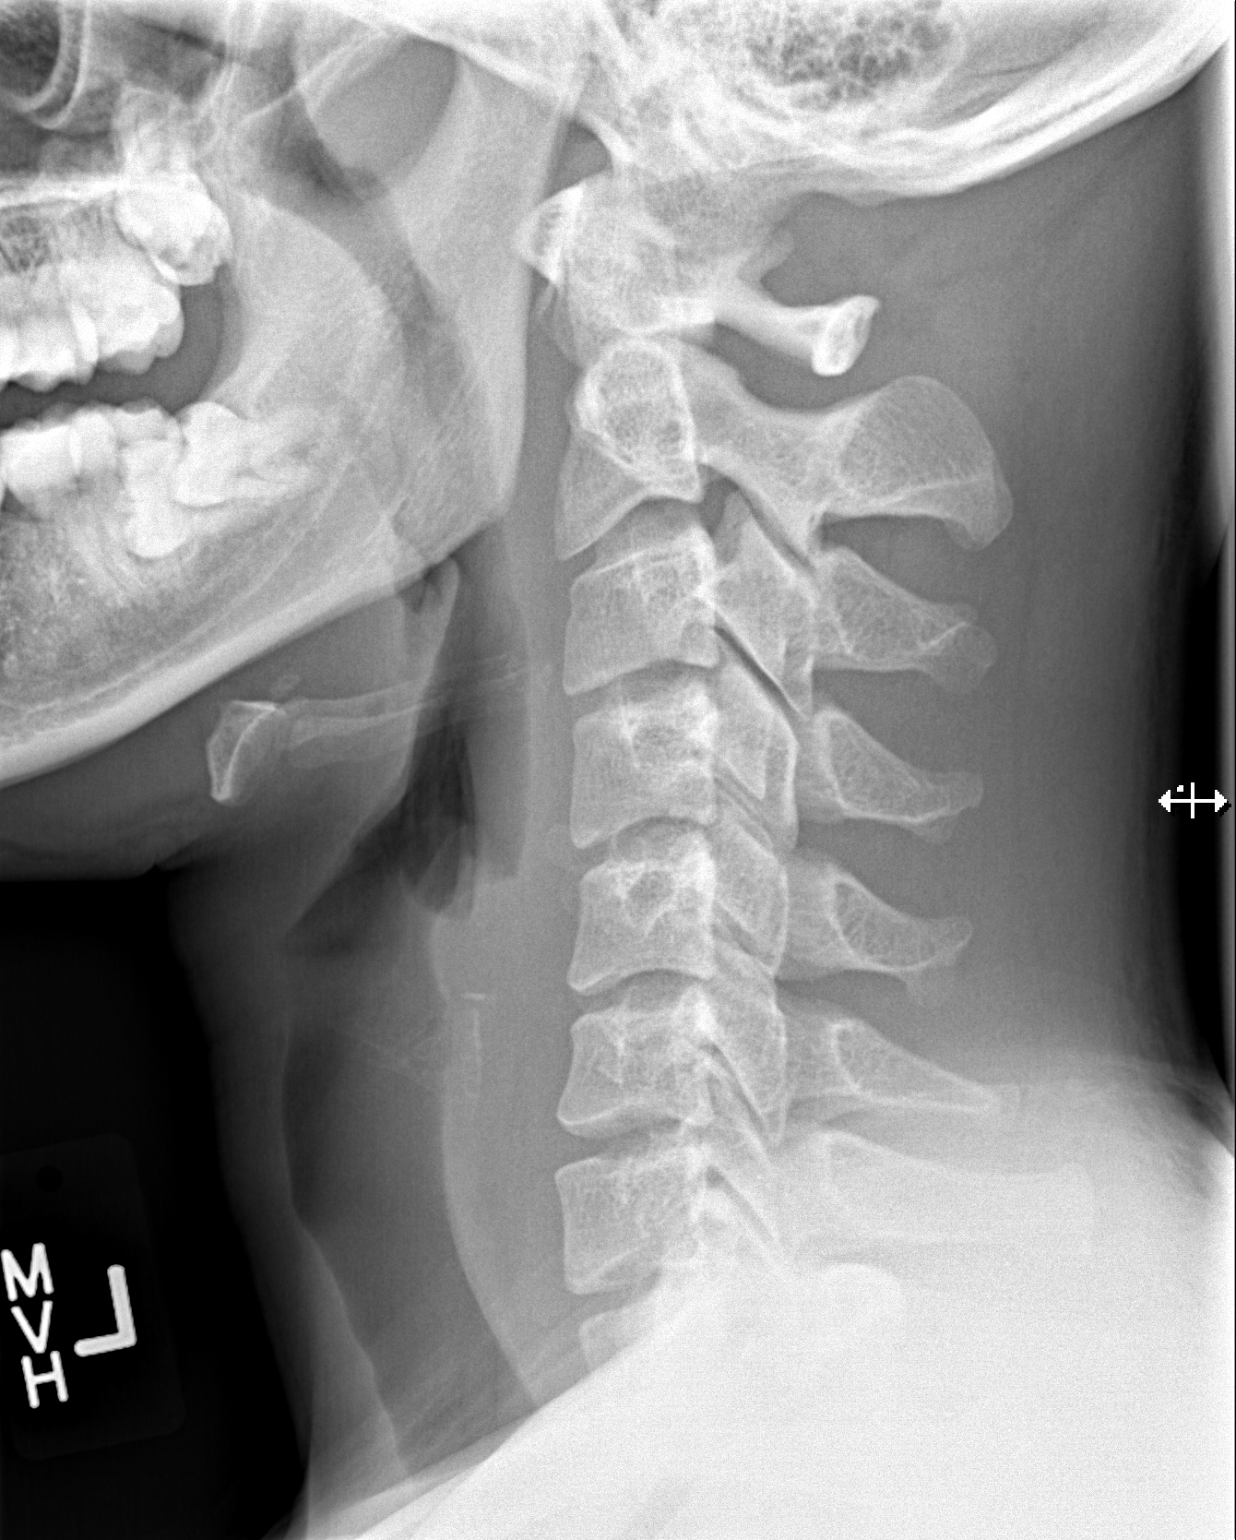

[w soft tissue neck lat]
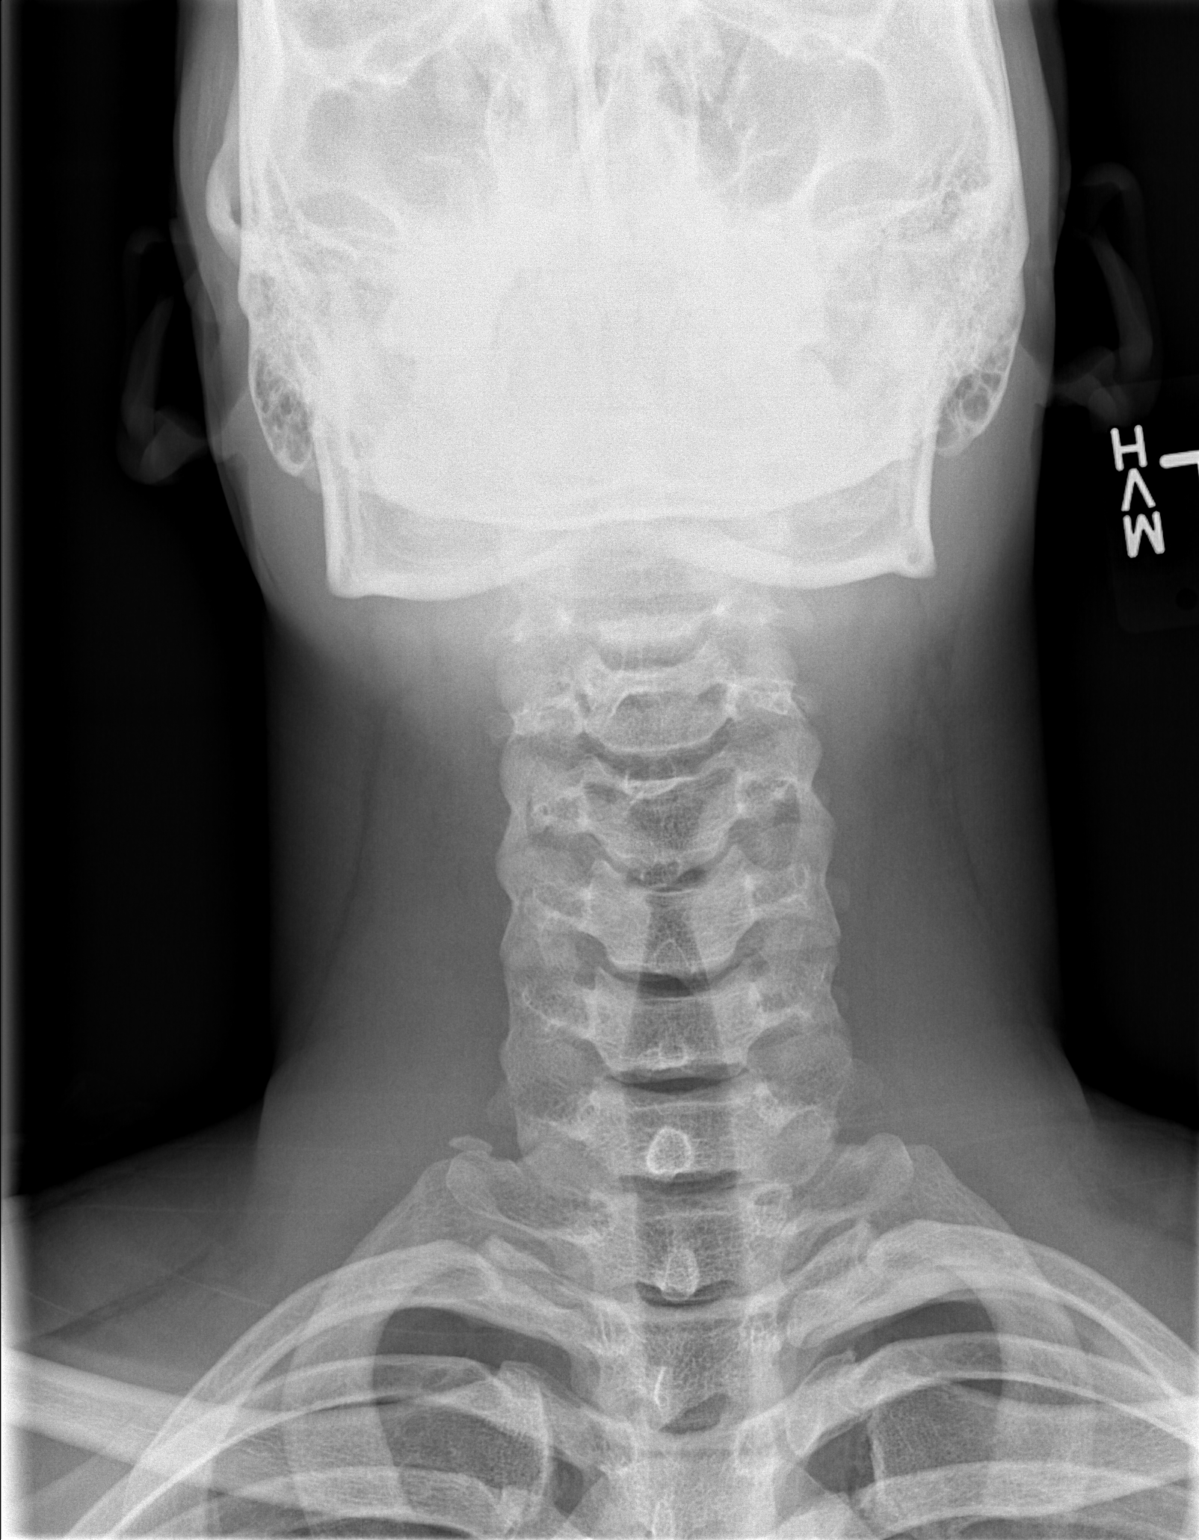

[2 of 2 positions shown; findings below may reference images not displayed]

FINDINGS: No prevertebral soft tissue swelling. The epiglottis is normal. The
trachea is normal. No bony abnormality.
IMPRESSION: No radiographic abnormality on lateral view of the neck

## 2014-01-14 MED ORDER — DEXAMETHASONE SODIUM PHOSPHATE 10 MG/ML IJ SOLN
10.0000 mg | Freq: Once | INTRAMUSCULAR | Status: AC
  Administered 2014-01-14: 10 mg via INTRAMUSCULAR
  Filled 2014-01-14: qty 1

## 2014-01-14 MED ORDER — HYDROCOD POLST-CHLORPHEN POLST 10-8 MG/5ML PO LQCR: 5.0000 mL | Freq: Two times a day (BID) | ORAL | Status: DC

## 2014-01-14 NOTE — Discharge Instructions (Signed)
Return to the emergency room with worsening of symptoms, new symptoms or with symptoms that are concerning. , especially fevers, stiff neck, worsening headache, nausea/vomiting, visual changes or slurred speech, chest pain, shortness of breath, cough with thick colored mucous or blood Drink plenty of fluids with electrolytes especially Gatorade. OTC cold medications such as mucinex, nyquil, dayquil are recommended. Chloraseptic for sore throat. Cough syrup for severe cough. Do not operate machinery, drive or drink alcohol while taking narcotics including cough syrup or muscle relaxers.   Cough, Adult  A cough is a reflex that helps clear your throat and airways. It can help heal the body or may be a reaction to an irritated airway. A cough may only last 2 or 3 weeks (acute) or may last more than 8 weeks (chronic).  CAUSES Acute cough:  Viral or bacterial infections. Chronic cough:  Infections.  Allergies.  Asthma.  Post-nasal drip.  Smoking.  Heartburn or acid reflux.  Some medicines.  Chronic lung problems (COPD).  Cancer. SYMPTOMS   Cough.  Fever.  Chest pain.  Increased breathing rate.  High-pitched whistling sound when breathing (wheezing).  Colored mucus that you cough up (sputum). TREATMENT   A bacterial cough may be treated with antibiotic medicine.  A viral cough must run its course and will not respond to antibiotics.  Your caregiver may recommend other treatments if you have a chronic cough. HOME CARE INSTRUCTIONS   Only take over-the-counter or prescription medicines for pain, discomfort, or fever as directed by your caregiver. Use cough suppressants only as directed by your caregiver.  Use a cold steam vaporizer or humidifier in your bedroom or home to help loosen secretions.  Sleep in a semi-upright position if your cough is worse at night.  Rest as needed.  Stop smoking if you smoke. SEEK IMMEDIATE MEDICAL CARE IF:   You have pus in your  sputum.  Your cough starts to worsen.  You cannot control your cough with suppressants and are losing sleep.  You begin coughing up blood.  You have difficulty breathing.  You develop pain which is getting worse or is uncontrolled with medicine.  You have a fever. MAKE SURE YOU:   Understand these instructions.  Will watch your condition.  Will get help right away if you are not doing well or get worse. Document Released: 08/05/2010 Document Revised: 05/01/2011 Document Reviewed: 08/05/2010 [facility] Patient Information 2015 [employer], [place]. This information is not intended to replace advice given to you by your health care provider. Make sure you discuss any questions you have with your health care provider.

## 2014-01-14 NOTE — ED Notes (Signed)
AVS explained in detail. Knows not to drink/drive while taking prescribed medications. Advised Mucinex and Ocean nasal spray. No other questions/concerns. RR even/unlabored. No SOB noted.

## 2014-01-14 NOTE — ED Provider Notes (Signed)
CSN: [id number]     Arrival date & time 01/14/14  1714 History  This chart was scribed for non-physician practitioner, Louann SjogrenVictoria, L Creech, PA-C, working with Rolan BuccoMelanie Belfi, MD, by Bronson CurbJacqueline Melvin, ED Scribe. This patient was seen in room WTR6/WTR6 and the patient's care was started at 5:26 PM.   Chief Complaint  Patient presents with  . Otalgia  . Nasal Congestion    yellow  . Throat swollen?     The history is provided by the patient. No language interpreter was used.     HPI Comments: Philip Ramos is a 18 y.o. male who presents to the Emergency Department complaining of constant, sore throat onset 1 week ago. There is associated "burning" otalgia, cough productive of thin clear sputum, congestion, left sided chest tightness only with cough, not activity. Pt endorses difficulty breathing through nose due to congestion. No SOB. Patient was evaluated by his PCP for his symptoms and was informed his symptoms were related to postnasal drip, however, patient is concerned for throat swelling and tightness that began after having soup and drinking green tea. He also notes associated nausea and no emesis. Patient has taken Tylenol today without significant relief. He denies fever, chills, rhinorrhea, rashes. No sick contacts. Patient is a current 1 ppd smoker without significant history of health conditions. Pt without cardiac history or history of chest pain.    History reviewed. No pertinent past medical history. History reviewed. No pertinent past surgical history. History reviewed. No pertinent family history. History  Substance Use Topics  . Smoking status: Current Every Day Smoker -- 1.00 packs/day    Types: Cigarettes  . Smokeless tobacco: Not on file     Comment: smokes black and milds  . Alcohol Use: No    Review of Systems  Constitutional: Negative for fever and chills.  HENT: Positive for congestion, ear pain, postnasal drip and sore throat. Negative for rhinorrhea.    Respiratory: Positive for cough and chest tightness (with cough).   Gastrointestinal: Positive for nausea and vomiting.  Skin: Negative for rash.      Allergies  Amoxicillin; Peanut-containing drug products; Penicillins; and Shellfish allergy  Home Medications   Prior to Admission medications   Medication Sig Start Date End Date Taking? Authorizing Provider  acetaminophen-codeine (TYLENOL #3) 300-30 MG per tablet Take 1 tablet by mouth 2 (two) times daily as needed for moderate pain. 11/03/13   Tomasita CrumbleAdeleke Oni, MD  chlorpheniramine-HYDROcodone (TUSSIONEX PENNKINETIC ER) 10-8 MG/5ML LQCR Take 5 mLs by mouth 2 (two) times daily. 01/14/14   Louann SjogrenVictoria L Creech, PA-C  ibuprofen (ADVIL,MOTRIN) 200 MG tablet Take 200 mg by mouth every 6 (six) hours as needed for moderate pain.    Historical Provider, MD  Menthol, Topical Analgesic, (ICY HOT ADVANCED RELIEF EX) Apply 1 application topically 3 (three) times daily as needed (pain).    Historical Provider, MD   Triage Vitals: BP 120/74 mmHg  Pulse 105  Temp(Src) 97.7 F (36.5 C) (Oral)  Resp 18  SpO2 98%  Physical Exam  Constitutional: He appears well-developed and well-nourished. No distress.  HENT:  Head: Normocephalic and atraumatic.  Right Ear: Tympanic membrane normal.  Left Ear: Tympanic membrane normal.  Nose: Right sinus exhibits no maxillary sinus tenderness and no frontal sinus tenderness. Left sinus exhibits no maxillary sinus tenderness and no frontal sinus tenderness.  Mouth/Throat: Mucous membranes are normal. Posterior oropharyngeal edema and posterior oropharyngeal erythema present. No oropharyngeal exudate.  Eyes: Conjunctivae and EOM are normal. Right eye exhibits  no discharge. Left eye exhibits no discharge.  Neck: Normal range of motion. Neck supple.  No nuchal rigidity  Cardiovascular: Normal rate, regular rhythm and normal heart sounds.   Pulmonary/Chest: Effort normal and breath sounds normal. No respiratory distress. He  has no wheezes. He has no rales.  Abdominal: Soft. Bowel sounds are normal. He exhibits no distension. There is no tenderness.  Lymphadenopathy:    He has cervical adenopathy (mild).  Neurological: He is alert.  Skin: Skin is warm and dry. He is not diaphoretic.  Nursing note and vitals reviewed.   ED Course  Procedures (including critical care time)  DIAGNOSTIC STUDIES: Oxygen Saturation is 98% on room air, normal by my interpretation.    COORDINATION OF CARE: At 1731 Discussed treatment plan with patient which includes CXR. Patient agrees.   Labs Review Labs Reviewed  RAPID STREP SCREEN  CULTURE, GROUP A STREP    Imaging Review Dg Neck Soft Tissue  01/14/2014   CLINICAL DATA:  Pt states increasing feeling of his throat swelling x 1 week - coughing/throat clearing  EXAM: NECK SOFT TISSUES - 1+ VIEW  COMPARISON:  Radiograph 05/26/2003  FINDINGS: No prevertebral soft tissue swelling. The epiglottis is normal. The trachea is normal. No bony abnormality.  IMPRESSION: No radiographic abnormality on lateral view of the neck   Electronically Signed   By: Genevive BiStewart  Edmunds M.D.   On: 01/14/2014 18:05   Dg Chest 2 View  01/14/2014   CLINICAL DATA:  Cough and throat swelling for 1 week.  EXAM: CHEST  2 VIEW  COMPARISON:  PA and lateral chest 11/02/2013.  FINDINGS: Heart size and mediastinal contours are within normal limits. Both lungs are clear. Visualized skeletal structures are unremarkable.  IMPRESSION: Negative exam.   Electronically Signed   By: Drusilla Kannerhomas  Dalessio M.D.   On: 01/14/2014 18:04     EKG Interpretation None      Meds given in ED:  Medications  dexamethasone (DECADRON) injection 10 mg (10 mg Intramuscular Given 01/14/14 1821)    Discharge Medication List as of 01/14/2014  6:31 PM        MDM   Final diagnoses:  Cough  URI (upper respiratory infection)   Pt CXR negative for acute infiltrate. Patients symptoms are consistent with URI, likely viral etiology.  Discussed that antibiotics are not indicated for viral infections. Pt also with sore throat with erythema and mild edema of tonsils but no other swelling. No difficulty breathing, rash, vomiting or abdominal pain.  Lungs CTAB. Pt in no respiratory distress. Pt tolerating PO fluids in ED. Soft tissue neck without acute abnormalities and strep negative. Pt given steroids in ED for symptomatic relief and discharged with tussionex.  Driving and sedation precautions provided. Pt with chest tightness with cough and not worsened with exertion. pt without cardiac history and low risk. I doubt cardiac etiology of chest tightness. Symptomatic treatment discussed as well.  Verbalizes understanding and is agreeable with plan. Pt is hemodynamically stable & in NAD prior to dc. Pt to follow up with wellness center for persistent symptoms.  Discussed return precautions with patient. Discussed all results and patient verbalizes understanding and agrees with plan.  I personally performed the services described in this documentation, which was scribed in my presence. The recorded information has been reviewed and is accurate.   Louann SjogrenVictoria L Creech, PA-C 01/14/14 2204  Rolan BuccoMelanie Belfi, MD 01/14/14 2255

## 2014-01-14 NOTE — ED Notes (Addendum)
Patient reports nasal congestion (yellow) without cough. Also c/o throat feeling "swollen and tight" but not a sore throat. Says throat tightness feeling started after drinking green tea and having soup. Denies choking or getting any foreign bodies stuck in throat. Throat appears red but airway is clear upon evaluation. Says he is having "a little trouble breathing." C/o ear pain r/t throat. Took 350 mg Tylenol today but is unsure of the time. Moving all extremities. RR even/unlabored.

## 2014-01-16 LAB — CULTURE, GROUP A STREP

## 2014-11-15 ENCOUNTER — Encounter (HOSPITAL_COMMUNITY): Payer: Self-pay | Admitting: Emergency Medicine

## 2014-11-15 ENCOUNTER — Emergency Department (HOSPITAL_COMMUNITY)
Admission: EM | Admit: 2014-11-15 | Discharge: 2014-11-15 | Disposition: A | Discharge status: Home / Self Care | Payer: Medicare Other | Attending: Emergency Medicine | Admitting: Emergency Medicine

## 2014-11-15 DIAGNOSIS — Y9289 Other specified places as the place of occurrence of the external cause: Secondary | ICD-10-CM | POA: Insufficient documentation

## 2014-11-15 DIAGNOSIS — Z79899 Other long term (current) drug therapy: Secondary | ICD-10-CM | POA: Diagnosis not present

## 2014-11-15 DIAGNOSIS — Y990 Civilian activity done for income or pay: Secondary | ICD-10-CM | POA: Diagnosis not present

## 2014-11-15 DIAGNOSIS — Z880 Allergy status to penicillin: Secondary | ICD-10-CM | POA: Insufficient documentation

## 2014-11-15 DIAGNOSIS — T63441A Toxic effect of venom of bees, accidental (unintentional), initial encounter: Secondary | ICD-10-CM | POA: Diagnosis present

## 2014-11-15 DIAGNOSIS — Z720 Tobacco use: Secondary | ICD-10-CM | POA: Diagnosis not present

## 2014-11-15 DIAGNOSIS — L299 Pruritus, unspecified: Secondary | ICD-10-CM | POA: Insufficient documentation

## 2014-11-15 DIAGNOSIS — Y9389 Activity, other specified: Secondary | ICD-10-CM | POA: Diagnosis not present

## 2014-11-15 MED ORDER — DIPHENHYDRAMINE HCL 25 MG PO TABS: 25.0000 mg | ORAL_TABLET | Freq: Four times a day (QID) | ORAL | Status: DC | PRN

## 2014-11-15 MED ORDER — DIPHENHYDRAMINE HCL 25 MG PO CAPS
25.0000 mg | ORAL_CAPSULE | Freq: Once | ORAL | Status: AC
  Administered 2014-11-15: 25 mg via ORAL
  Filled 2014-11-15: qty 1

## 2014-11-15 NOTE — ED Notes (Signed)
Pt says he was stung by a bee at work on Friday. Stinger removed by girlfriend that evening. Pt c/o pain, swelling itching to entire first digit of R hand. Never been stung before. Denies SOB or lightheadedness.

## 2014-11-15 NOTE — ED Provider Notes (Signed)
CSN: [id number]     Arrival date & time 11/15/14  0358 History   First MD Initiated Contact with Patient 11/15/14 [phone]     Chief Complaint  Patient presents with  . Insect Bite     (Consider location/radiation/quality/duration/timing/severity/associated sxs/prior Treatment) HPI Comments: 19 year old male presents to the emergency department for further evaluation of itching to his right second digit. Symptoms have been present since he was stung by a bee at work 2 days ago. Stinger removed by girlfriend. Patient has not taken any medications for his symptoms. He denies any lip swelling or tongue swelling. He reports subjective swelling of his right second digit. No numbness, tingling, or weakness. No shortness of breath or lightheadedness. No fever.  The history is provided by the patient. No language interpreter was used.    History reviewed. No pertinent past medical history. History reviewed. No pertinent past surgical history. History reviewed. No pertinent family history. Social History  Substance Use Topics  . Smoking status: Current Every Day Smoker -- 1.00 packs/day    Types: Cigarettes  . Smokeless tobacco: Never Used     Comment: smokes black and milds  . Alcohol Use: 0.6 oz/week    1 Cans of beer per week    Review of Systems  Skin: Positive for wound.  All other systems reviewed and are negative.   Allergies  Amoxicillin; Peanut-containing drug products; Penicillins; and Shellfish allergy  Home Medications   Prior to Admission medications   Medication Sig Start Date End Date Taking? Authorizing Provider  acetaminophen-codeine (TYLENOL #3) 300-30 MG per tablet Take 1 tablet by mouth 2 (two) times daily as needed for moderate pain. Patient not taking: Reported on 11/15/2014 11/03/13   Tomasita Crumble, MD  chlorpheniramine-HYDROcodone [facility] PENNKINETIC ER) 10-8 MG/5ML LQCR Take 5 mLs by mouth 2 (two) times daily. Patient not taking: Reported on 11/15/2014 01/14/14    Oswaldo Conroy, PA-C  diphenhydrAMINE (BENADRYL) 25 MG tablet Take 1 tablet (25 mg total) by mouth every 6 (six) hours as needed for itching (Rash). 11/15/14   Antony Madura, PA-C   BP 107/71 mmHg  Pulse 69  Temp(Src) 97.5 F (36.4 C) (Oral)  Resp 20  Ht  (1.753 m)  Wt 160 lb (72.576 kg)  BMI 23.62 kg/m2  SpO2 99%   Physical Exam  Constitutional: He is oriented to person, place, and time. He appears well-developed and well-nourished. No distress.  HENT:  Head: Normocephalic and atraumatic.  Eyes: Conjunctivae and EOM are normal. No scleral icterus.  Neck: Normal range of motion.  Cardiovascular: Normal rate, regular rhythm and intact distal pulses.   Distal radial pulse 2+ in the right upper extremity. Capillary refill brisk in all digits of right hand.  Pulmonary/Chest: Effort normal. No respiratory distress.  Musculoskeletal: Normal range of motion.       Right hand: He exhibits normal range of motion, no tenderness, no bony tenderness, no deformity, no laceration and no swelling. Normal sensation noted.       Hands: Itching to R 2nd digit. No swelling, erythema, heat to touch, or red streaking. No purulence or induration. No FB visualized. Normal ROM.  Neurological: He is alert and oriented to person, place, and time. He exhibits normal muscle tone. Coordination normal.  Skin: Skin is warm and dry. No rash noted. He is not diaphoretic. No erythema. No pallor.  Psychiatric: He has a normal mood and affect. His behavior is normal.  Nursing note and vitals reviewed.   ED Course  Procedures (including critical care time) Labs Review Labs Reviewed - No data to display  Imaging Review No results found.  I have personally reviewed and evaluated these images and lab results as part of my medical decision-making.   EKG Interpretation None      MDM   Final diagnoses:  Pruritus    19 year old male presents to the emergency department for further evaluation of itching  to his right second digit secondary to a bee sting. Patient is neurovascularly intact. No concern for secondary cellulitis or abscess. Will discharge with Benadryl. Return precautions given at discharge. Patient discharged in good condition with no unaddressed concerns.   Filed Vitals:   11/15/14 0418  BP: 107/71  Pulse: 69  Temp: 97.5 F (36.4 C)  TempSrc: Oral  Resp: 20  Height:  (1.753 m)  Weight: 160 lb (72.576 kg)  SpO2: 99%     Antony Madura, PA-C 11/15/14 0550  Devoria Albe, MD 11/15/14 [phone]

## 2014-11-15 NOTE — Discharge Instructions (Signed)
Pruritus  °Pruritus is an itch. There are many different problems that can cause an itch. Dry skin is one of the most common causes of itching. Most cases of itching do not require medical attention.  °HOME CARE INSTRUCTIONS  °Make sure your skin is moistened on a regular basis. A moisturizer that contains petroleum jelly is best for keeping moisture in your skin. If you develop a rash, you may try the following for relief:  °· Use corticosteroid cream. °· Apply cool compresses to the affected areas. °· Bathe with Epsom salts or baking soda in the bathwater. °· Soak in colloidal oatmeal baths. These are available at your pharmacy. °· Apply baking soda paste to the rash. Stir water into baking soda until it reaches a paste-like consistency. °· Use an anti-itch lotion. °· Take over-the-counter diphenhydramine medicine by mouth as the instructions direct. °· Avoid scratching. Scratching may cause the rash to become infected. If itching is very bad, your caregiver may suggest prescription lotions or creams to lessen your symptoms. °· Avoid hot showers, which can make itching worse. A cold shower may help with itching as long as you use a moisturizer after the shower. °SEEK MEDICAL CARE IF: °The itching does not go away after several days. °Document Released: 10/19/2010 Document Revised: 06/23/2013 Document Reviewed: 10/19/2010 °ExitCare® Patient Information ©2015 [employer]. This information is not intended to replace advice given to you by your health care provider. Make sure you discuss any questions you have with your health care provider. ° °

## 2015-03-08 ENCOUNTER — Emergency Department (HOSPITAL_COMMUNITY)
Admission: EM | Admit: 2015-03-08 | Discharge: 2015-03-09 | Disposition: A | Discharge status: Home / Self Care | Payer: Medicare Other | Attending: Emergency Medicine | Admitting: Emergency Medicine

## 2015-03-08 ENCOUNTER — Emergency Department (HOSPITAL_COMMUNITY)
Admission: EM | Admit: 2015-03-08 | Discharge: 2015-03-08 | Disposition: A | Discharge status: Home / Self Care | Payer: Medicare Other

## 2015-03-08 ENCOUNTER — Encounter (HOSPITAL_COMMUNITY): Payer: Self-pay | Admitting: Emergency Medicine

## 2015-03-08 DIAGNOSIS — Z880 Allergy status to penicillin: Secondary | ICD-10-CM | POA: Diagnosis not present

## 2015-03-08 DIAGNOSIS — M542 Cervicalgia: Secondary | ICD-10-CM | POA: Insufficient documentation

## 2015-03-08 DIAGNOSIS — R51 Headache: Secondary | ICD-10-CM

## 2015-03-08 DIAGNOSIS — J069 Acute upper respiratory infection, unspecified: Secondary | ICD-10-CM | POA: Insufficient documentation

## 2015-03-08 DIAGNOSIS — H5713 Ocular pain, bilateral: Secondary | ICD-10-CM | POA: Diagnosis not present

## 2015-03-08 DIAGNOSIS — R519 Headache, unspecified: Secondary | ICD-10-CM

## 2015-03-08 DIAGNOSIS — F17210 Nicotine dependence, cigarettes, uncomplicated: Secondary | ICD-10-CM | POA: Insufficient documentation

## 2015-03-08 MED ORDER — DIPHENHYDRAMINE HCL 25 MG PO CAPS
25.0000 mg | ORAL_CAPSULE | Freq: Once | ORAL | Status: AC
  Administered 2015-03-08: 25 mg via ORAL
  Filled 2015-03-08: qty 1

## 2015-03-08 MED ORDER — METOCLOPRAMIDE HCL 10 MG PO TABS
5.0000 mg | ORAL_TABLET | Freq: Once | ORAL | Status: AC
  Administered 2015-03-08: 5 mg via ORAL
  Filled 2015-03-08: qty 1

## 2015-03-08 MED ORDER — KETOROLAC TROMETHAMINE 30 MG/ML IJ SOLN
30.0000 mg | Freq: Once | INTRAMUSCULAR | Status: AC
  Administered 2015-03-08: 30 mg via INTRAMUSCULAR
  Filled 2015-03-08: qty 1

## 2015-03-08 NOTE — ED Notes (Signed)
Unable to locate for triage x3 

## 2015-03-08 NOTE — ED Notes (Signed)
Pt states he feels weak all over and has a headache and some congestion  Pt states sxs started last night  Pt denies vomiting or diarrhea

## 2015-03-08 NOTE — ED Provider Notes (Signed)
CSN: [id number]     Arrival date & time 03/08/15  2024 History  By signing my name below, I, Phillis Haggis, attest that this documentation has been prepared under the direction and in the presence of Eyvonne Mechanic, PA-C Electronically Signed: Phillis Haggis, ED Scribe. 03/08/2015. 10:50 PM.   Chief Complaint  Patient presents with  . Headache   The history is provided by the patient. No language interpreter was used.  HPI Comments: Philip Ramos is a 20 y.o. male who presents to the Emergency Department complaining of a gradually worsening, constant frontal headache onset one day ago. Pt reports associated generalized weakness , bilateral eye pain, non-productive cough, neck pain, sneezing, rhinorrhea and congestion. Pt has not tried anything for his symptoms. He denies vomiting, diarrhea, or numbness. Pt is a smoker, but has not smoked today.  History reviewed. No pertinent past medical history. History reviewed. No pertinent past surgical history. Family History  Problem Relation Age of Onset  . Hypertension Other   . Diabetes Other   . Cancer Other    Social History  Substance Use Topics  . Smoking status: Current Every Day Smoker -- 1.00 packs/day    Types: Cigarettes  . Smokeless tobacco: Never Used     Comment: smokes black and milds  . Alcohol Use: 0.6 oz/week    1 Cans of beer per week     Comment: occ    Review of Systems  All other systems reviewed and are negative.  Allergies  Amoxicillin; Peanut-containing drug products; Penicillins; and Shellfish allergy  Home Medications   Prior to Admission medications   Medication Sig Start Date End Date Taking? Authorizing Provider  acetaminophen-codeine (TYLENOL #3) 300-30 MG per tablet Take 1 tablet by mouth 2 (two) times daily as needed for moderate pain. Patient not taking: Reported on 11/15/2014 11/03/13   Tomasita Crumble, MD  chlorpheniramine-HYDROcodone [facility] PENNKINETIC ER) 10-8 MG/5ML LQCR Take 5 mLs by mouth 2  (two) times daily. Patient not taking: Reported on 11/15/2014 01/14/14   Oswaldo Conroy, PA-C  diphenhydrAMINE (BENADRYL) 25 MG tablet Take 1 tablet (25 mg total) by mouth every 6 (six) hours as needed for itching (Rash). Patient not taking: Reported on 03/08/2015 11/15/14   Antony Madura, PA-C   BP 92/59 mmHg  Pulse 84  Temp(Src) 98.7 F (37.1 C) (Oral)  Resp 16  Ht 5\' 9"  (1.753 m)  Wt 72.576 kg  BMI 23.62 kg/m2  SpO2 96% Physical Exam  Constitutional: He is oriented to person, place, and time. He appears well-developed and well-nourished. No distress.  HENT:  Head: Normocephalic and atraumatic.  Right Ear: Tympanic membrane and ear canal normal.  Left Ear: Tympanic membrane and ear canal normal.  Mouth/Throat: Oropharynx is clear and moist. No oropharyngeal exudate.  Eyes: Conjunctivae and EOM are normal. Pupils are equal, round, and reactive to light. Right eye exhibits no discharge. Left eye exhibits no discharge. No scleral icterus.  Neck: Normal range of motion. Neck supple. No JVD present.  Cardiovascular: Normal rate and regular rhythm.   Pulmonary/Chest: Effort normal and breath sounds normal. No stridor.  Abdominal: Soft. There is no tenderness.  Musculoskeletal: Normal range of motion. He exhibits no edema or tenderness.  Lymphadenopathy:    He has no cervical adenopathy.  Neurological: He is alert and oriented to person, place, and time. He has normal strength. He displays no atrophy and no tremor. No cranial nerve deficit or sensory deficit. He exhibits normal muscle tone. He displays a negative  Romberg sign. He displays no seizure activity. Coordination and gait normal. GCS eye subscore is 4. GCS verbal subscore is 5. GCS motor subscore is 6.  Reflex Scores:      Patellar reflexes are 2+ on the right side and 2+ on the left side. Skin: He is not diaphoretic.  Nursing note and vitals reviewed.   ED Course  Procedures (including critical care time) DIAGNOSTIC  STUDIES: Oxygen Saturation is 95% on RA, adequate by my interpretation.    COORDINATION OF CARE: 10:49 PM-Discussed treatment plan which includes nasal decongestant and pain medication with pt at bedside and pt agreed to plan.    Labs Review Labs Reviewed - No data to display  Imaging Review No results found. I have personally reviewed and evaluated these images and lab results as part of my medical decision-making.   EKG Interpretation None      MDM   Final diagnoses:  URI, acute  Acute nonintractable headache, unspecified headache type  Labs:  Imaging:  Consults:  Therapeutics:  Discharge Meds:   Assessment/Plan: Patient presentation is most consistent with viral URI with headache. Patient has no red flags for headache, was treated here with above medications with good symptomatic improvement. Discharged home with symptomatic care instructions and primary care follow-up. Patient verbalized understanding and agreement to today's plan    I personally performed the services described in this documentation, which was scribed in my presence. The recorded information has been reviewed and is accurate.    Eyvonne MechanicJeffrey Hedges, PA-C 03/10/15 0503  Lavera Guiseana Duo Liu, MD 03/10/15 2019

## 2015-03-08 NOTE — Discharge Instructions (Signed)
Please read attached information. If you experience any new or worsening signs or symptoms please return to the emergency room for evaluation. Please follow-up with your primary care provider or specialist as discussed. Please use medication prescribed only as directed and discontinue taking if you have any concerning signs or symptoms.   °

## 2015-06-07 ENCOUNTER — Emergency Department (HOSPITAL_COMMUNITY): Payer: Medicare Other

## 2015-06-07 ENCOUNTER — Encounter (HOSPITAL_COMMUNITY): Payer: Self-pay | Admitting: Emergency Medicine

## 2015-06-07 ENCOUNTER — Emergency Department (HOSPITAL_COMMUNITY)
Admission: EM | Admit: 2015-06-07 | Discharge: 2015-06-07 | Disposition: A | Discharge status: Home / Self Care | Payer: Medicare Other | Attending: Emergency Medicine | Admitting: Emergency Medicine

## 2015-06-07 DIAGNOSIS — Z880 Allergy status to penicillin: Secondary | ICD-10-CM | POA: Insufficient documentation

## 2015-06-07 DIAGNOSIS — R062 Wheezing: Secondary | ICD-10-CM | POA: Diagnosis not present

## 2015-06-07 DIAGNOSIS — F17210 Nicotine dependence, cigarettes, uncomplicated: Secondary | ICD-10-CM | POA: Insufficient documentation

## 2015-06-07 DIAGNOSIS — J019 Acute sinusitis, unspecified: Secondary | ICD-10-CM | POA: Insufficient documentation

## 2015-06-07 DIAGNOSIS — M940 Chondrocostal junction syndrome [Tietze]: Secondary | ICD-10-CM | POA: Diagnosis not present

## 2015-06-07 DIAGNOSIS — H9203 Otalgia, bilateral: Secondary | ICD-10-CM | POA: Diagnosis not present

## 2015-06-07 DIAGNOSIS — R05 Cough: Secondary | ICD-10-CM | POA: Diagnosis present

## 2015-06-07 DIAGNOSIS — M545 Low back pain: Secondary | ICD-10-CM | POA: Insufficient documentation

## 2015-06-07 IMAGING — CR DG CHEST 2V
2 series · 2 of 2 positions shown · non-contrast
Comparison: Radiograph [DATE]

CLINICAL DATA: Left thoracic back pain. Pleuritic chest pain.
Cough.

EXAM:
CHEST  2 VIEW

[w chest pa]
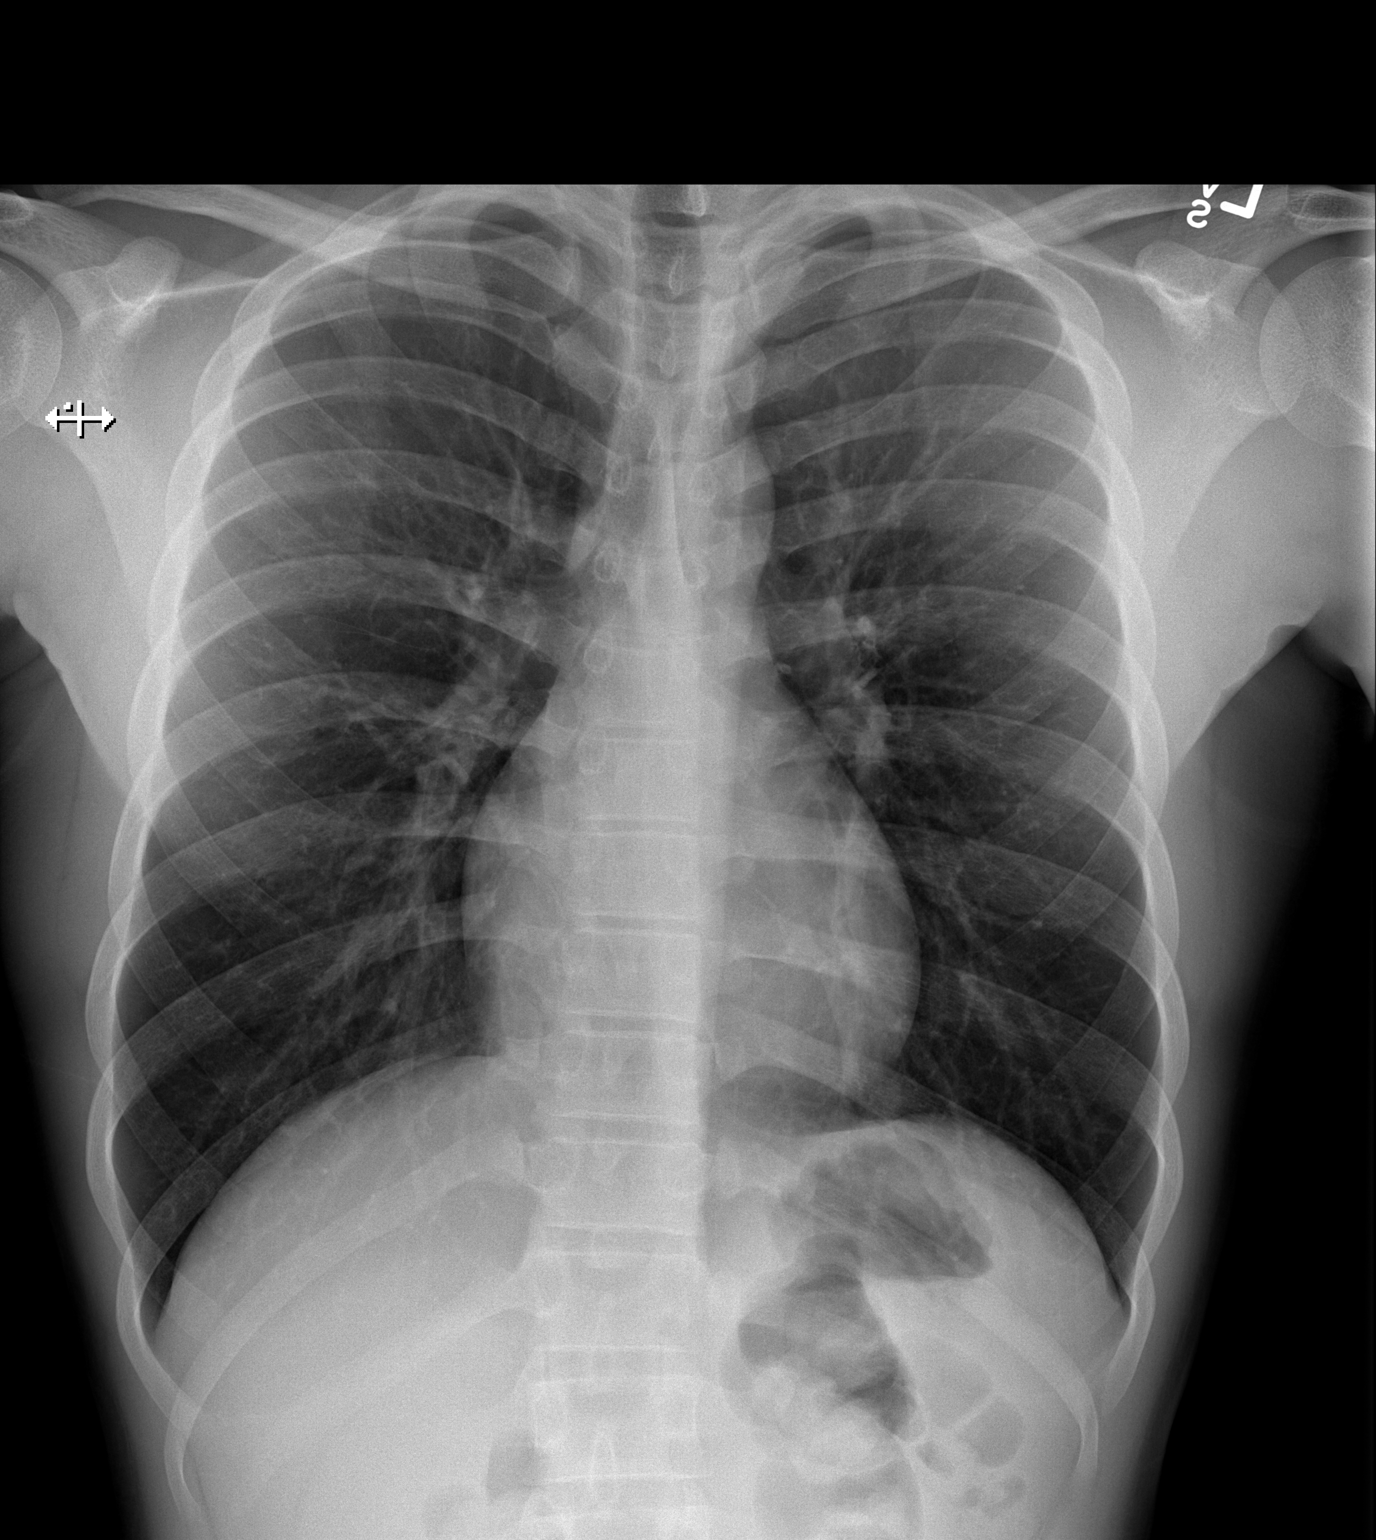

[w chest lat]
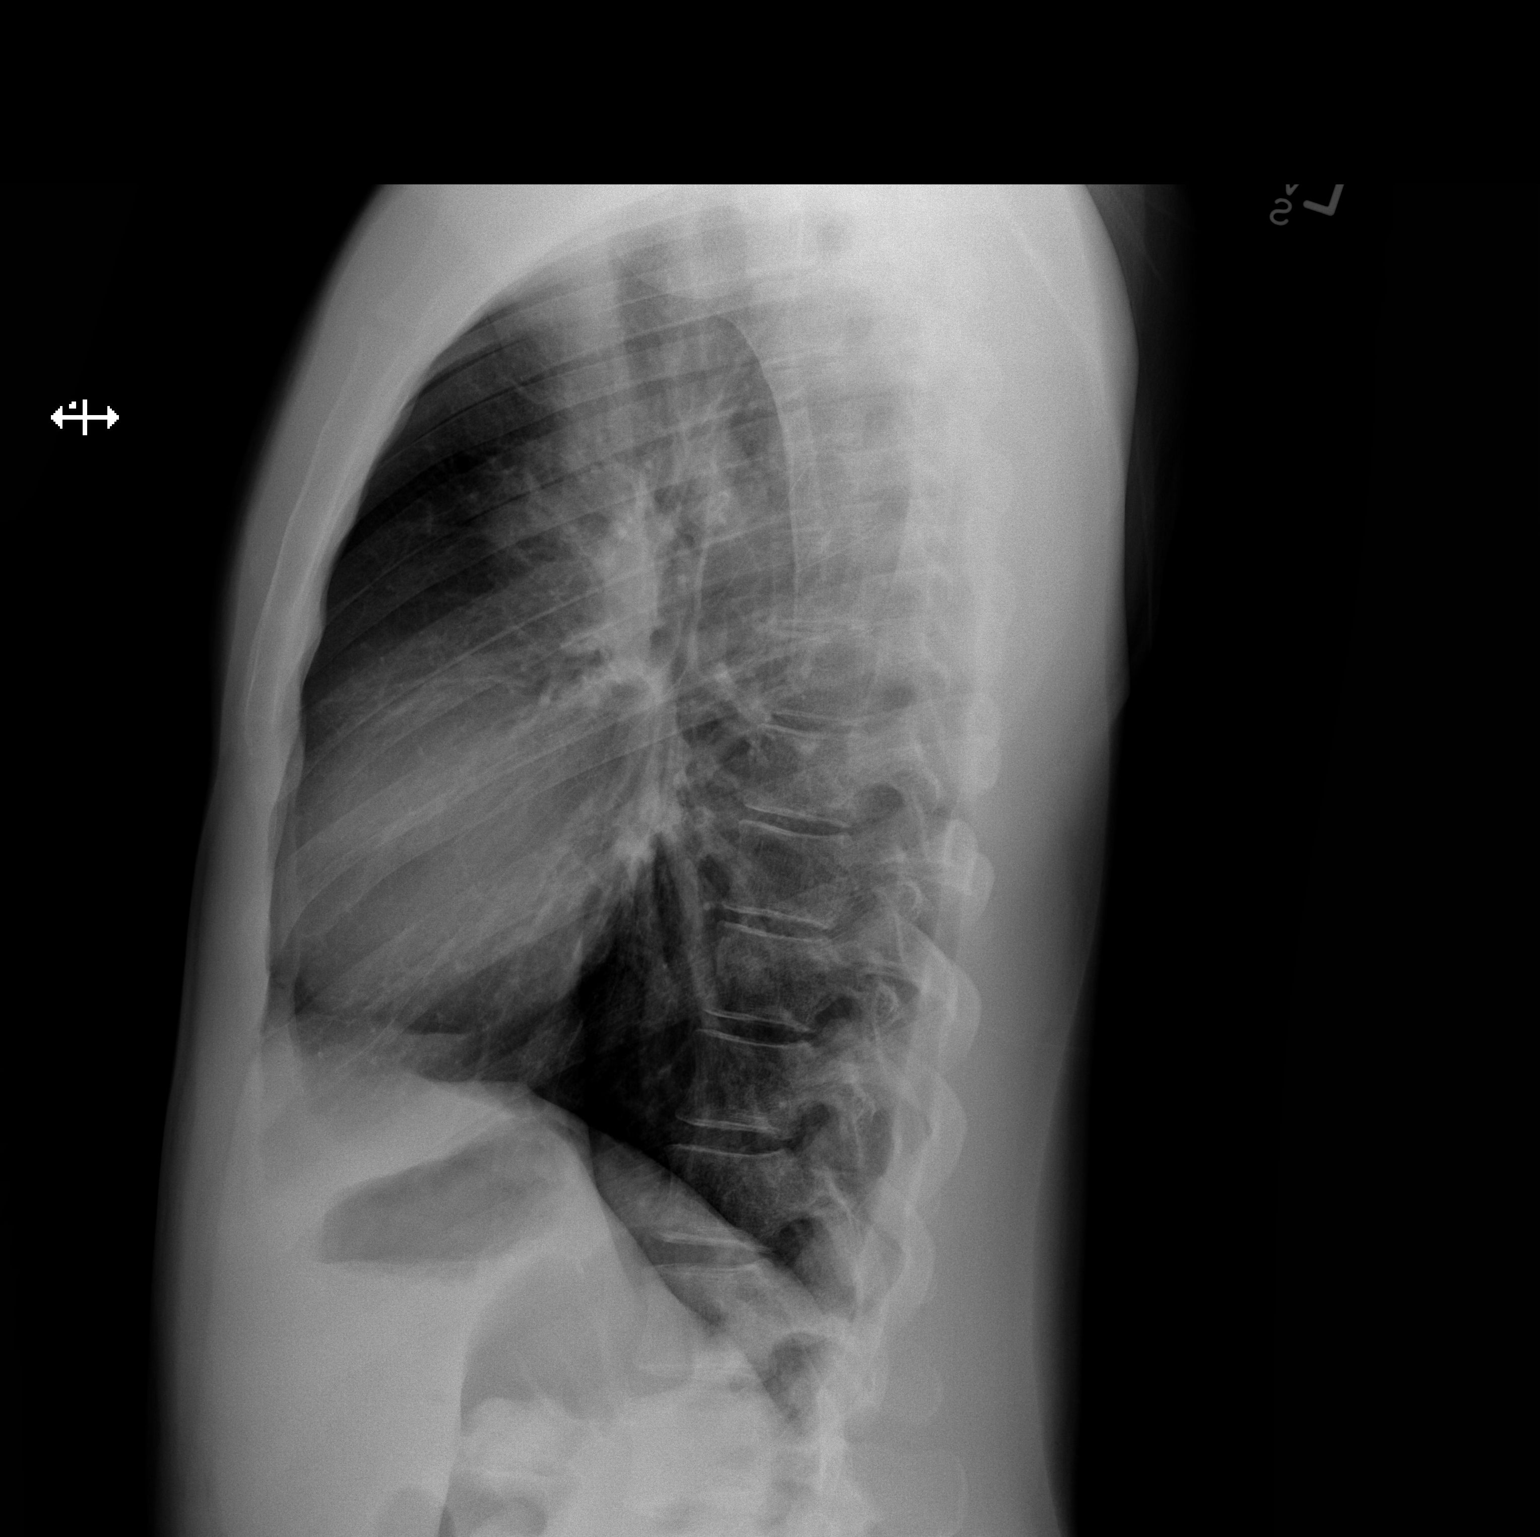

[2 of 2 positions shown; findings below may reference images not displayed]

FINDINGS: The cardiomediastinal contours are normal. The lungs are clear.
Pulmonary vasculature is normal. No consolidation, pleural effusion,
or pneumothorax. No acute osseous abnormalities are seen.
IMPRESSION: No active cardiopulmonary disease.

## 2015-06-07 MED ORDER — BENZONATATE 100 MG PO CAPS: 100.0000 mg | ORAL_CAPSULE | Freq: Three times a day (TID) | ORAL | Status: DC | PRN

## 2015-06-07 MED ORDER — IPRATROPIUM-ALBUTEROL 0.5-2.5 (3) MG/3ML IN SOLN
3.0000 mL | Freq: Once | RESPIRATORY_TRACT | Status: AC
  Administered 2015-06-07: 3 mL via RESPIRATORY_TRACT
  Filled 2015-06-07: qty 3

## 2015-06-07 MED ORDER — OXYMETAZOLINE HCL 0.05 % NA SOLN
2.0000 | Freq: Once | NASAL | Status: AC
  Administered 2015-06-07: 2 via NASAL
  Filled 2015-06-07 (×2): qty 15

## 2015-06-07 MED ORDER — NAPROXEN 500 MG PO TABS: 500.0000 mg | ORAL_TABLET | Freq: Two times a day (BID) | ORAL | Status: DC

## 2015-06-07 MED ORDER — ALBUTEROL SULFATE HFA 108 (90 BASE) MCG/ACT IN AERS
2.0000 | INHALATION_SPRAY | Freq: Once | RESPIRATORY_TRACT | Status: AC
  Administered 2015-06-07: 2 via RESPIRATORY_TRACT
  Filled 2015-06-07: qty 6.7

## 2015-06-07 MED ORDER — NAPROXEN 500 MG PO TABS
500.0000 mg | ORAL_TABLET | Freq: Once | ORAL | Status: AC
  Administered 2015-06-07: 500 mg via ORAL
  Filled 2015-06-07: qty 1

## 2015-06-07 MED ORDER — CETIRIZINE-PSEUDOEPHEDRINE ER 5-120 MG PO TB12: 1.0000 | ORAL_TABLET | Freq: Two times a day (BID) | ORAL | Status: DC

## 2015-06-07 MED ORDER — PREDNISONE 20 MG PO TABS
60.0000 mg | ORAL_TABLET | Freq: Once | ORAL | Status: AC
  Administered 2015-06-07: 60 mg via ORAL
  Filled 2015-06-07: qty 3

## 2015-06-07 NOTE — Discharge Instructions (Signed)
Using albuterol inhaler, 2 puffs every 4-6 hours, as needed for cough and shortness of breath. Use Afrin nasal spray, 2 puffs in each nostril twice a day, for no more than 3 days. Take Tessalon as prescribed for cough and Zyrtec D for congestion. You may take naproxen for pain control. Follow-up with your primary care doctor.  Sinusitis, Adult Sinusitis is redness, soreness, and inflammation of the paranasal sinuses. Paranasal sinuses are air pockets within the bones of your face. They are located beneath your eyes, in the middle of your forehead, and above your eyes. In healthy paranasal sinuses, mucus is able to drain out, and air is able to circulate through them by way of your nose. However, when your paranasal sinuses are inflamed, mucus and air can become trapped. This can allow bacteria and other germs to grow and cause infection. Sinusitis can develop quickly and last only a short time (acute) or continue over a long period (chronic). Sinusitis that lasts for more than 12 weeks is considered chronic. CAUSES Causes of sinusitis include:  Allergies.  Structural abnormalities, such as displacement of the cartilage that separates your nostrils (deviated septum), which can decrease the air flow through your nose and sinuses and affect sinus drainage.  Functional abnormalities, such as when the small hairs (cilia) that line your sinuses and help remove mucus do not work properly or are not present. SIGNS AND SYMPTOMS Symptoms of acute and chronic sinusitis are the same. The primary symptoms are pain and pressure around the affected sinuses. Other symptoms include:  Upper toothache.  Earache.  Headache.  Bad breath.  Decreased sense of smell and taste.  A cough, which worsens when you are lying flat.  Fatigue.  Fever.  Thick drainage from your nose, which often is green and may contain pus (purulent).  Swelling and warmth over the affected sinuses. DIAGNOSIS Your health care  provider will perform a physical exam. During your exam, your health care provider may perform any of the following to help determine if you have acute sinusitis or chronic sinusitis:  Look in your nose for signs of abnormal growths in your nostrils (nasal polyps).  Tap over the affected sinus to check for signs of infection.  View the inside of your sinuses using an imaging device that has a light attached (endoscope). If your health care provider suspects that you have chronic sinusitis, one or more of the following tests may be recommended:  Allergy tests.  Nasal culture. A sample of mucus is taken from your nose, sent to a lab, and screened for bacteria.  Nasal cytology. A sample of mucus is taken from your nose and examined by your health care provider to determine if your sinusitis is related to an allergy. TREATMENT Most cases of acute sinusitis are related to a viral infection and will resolve on their own within 10 days. Sometimes, medicines are prescribed to help relieve symptoms of both acute and chronic sinusitis. These may include pain medicines, decongestants, nasal steroid sprays, or saline sprays. However, for sinusitis related to a bacterial infection, your health care provider will prescribe antibiotic medicines. These are medicines that will help kill the bacteria causing the infection. Rarely, sinusitis is caused by a fungal infection. In these cases, your health care provider will prescribe antifungal medicine. For some cases of chronic sinusitis, surgery is needed. Generally, these are cases in which sinusitis recurs more than 3 times per year, despite other treatments. HOME CARE INSTRUCTIONS  Drink plenty of water. Water helps  thin the mucus so your sinuses can drain more easily.  Use a humidifier.  Inhale steam 3-4 times a day (for example, sit in the bathroom with the shower running).  Apply a warm, moist washcloth to your face 3-4 times a day, or as directed by  your health care provider.  Use saline nasal sprays to help moisten and clean your sinuses.  Take medicines only as directed by your health care provider.  If you were prescribed either an antibiotic or antifungal medicine, finish it all even if you start to feel better. SEEK IMMEDIATE MEDICAL CARE IF:  You have increasing pain or severe headaches.  You have nausea, vomiting, or drowsiness.  You have swelling around your face.  You have vision problems.  You have a stiff neck.  You have difficulty breathing.   This information is not intended to replace advice given to you by your health care provider. Make sure you discuss any questions you have with your health care provider.   Document Released: 02/06/2005 Document Revised: 02/27/2014 Document Reviewed: 02/21/2011 Elsevier Interactive Patient Education [employer] Elsevier Inc.

## 2015-06-07 NOTE — ED Provider Notes (Signed)
CSN: [id number]     Arrival date & time 06/07/15  2045 History  By signing my name below, I, Doreatha Martin, attest that this documentation has been prepared under the direction and in the presence of [employer], PA-C. Electronically Signed: Doreatha Martin, ED Scribe. 06/07/2015. 10:29 PM.     Chief Complaint  Patient presents with  . Nasal Congestion    The history is provided by the patient. No language interpreter was used.     HPI Comments: Philip Ramos is a 20 y.o. male who presents to the Emergency Department complaining of moderate lower back pain onset today. He also complains of nasal congestion, wheezing, chest tightness, cough, bilateral otalgia. Pt states his back pain is worsened with coughing and deep inhalation. No h/o asthma, seasonal allergies. Pt states he has taken an antibiotic with no relief of symptoms. He has not taken any OTC cold and flu or pain medications PTA. Pt denies taking OTC medications at home to improve symptoms. Denies fever, emesis, nausea, abdominal pain, SOB, CP.    History reviewed. No pertinent past medical history. History reviewed. No pertinent past surgical history. Family History  Problem Relation Age of Onset  . Hypertension Other   . Diabetes Other   . Cancer Other    Social History  Substance Use Topics  . Smoking status: Current Every Day Smoker -- 1.00 packs/day    Types: Cigarettes  . Smokeless tobacco: Never Used     Comment: smokes black and milds  . Alcohol Use: 0.6 oz/week    1 Cans of beer per week     Comment: occ     Review of Systems  Constitutional: Negative for fever.  HENT: Positive for congestion and ear pain.   Respiratory: Positive for cough, chest tightness and wheezing.   Cardiovascular: Negative for chest pain.  Gastrointestinal: Negative for nausea, vomiting and abdominal pain.  Musculoskeletal: Positive for back pain.  All other systems reviewed and are negative.   Allergies  Amoxicillin;  Peanut-containing drug products; Penicillins; and Shellfish allergy  Home Medications   Prior to Admission medications   Medication Sig Start Date End Date Taking? Authorizing Provider  acetaminophen-codeine (TYLENOL #3) 300-30 MG per tablet Take 1 tablet by mouth 2 (two) times daily as needed for moderate pain. Patient not taking: Reported on 11/15/2014 11/03/13   Tomasita Crumble, MD  benzonatate (TESSALON) 100 MG capsule Take 1 capsule (100 mg total) by mouth 3 (three) times daily as needed for cough. 06/07/15   Antony Madura, PA-C  cetirizine-pseudoephedrine (ZYRTEC-D) 5-120 MG tablet Take 1 tablet by mouth 2 (two) times daily. 06/07/15   Antony Madura, PA-C  chlorpheniramine-HYDROcodone (TUSSIONEX PENNKINETIC ER) 10-8 MG/5ML LQCR Take 5 mLs by mouth 2 (two) times daily. Patient not taking: Reported on 11/15/2014 01/14/14   Oswaldo Conroy, PA-C  diphenhydrAMINE (BENADRYL) 25 MG tablet Take 1 tablet (25 mg total) by mouth every 6 (six) hours as needed for itching (Rash). Patient not taking: Reported on 03/08/2015 11/15/14   Antony Madura, PA-C  naproxen (NAPROSYN) 500 MG tablet Take 1 tablet (500 mg total) by mouth 2 (two) times daily. 06/07/15   Antony Madura, PA-C    BP 116/80 mmHg  Pulse 69  Temp(Src) 97.9 F (36.6 C) (Oral)  Resp 18  Ht  (1.778 m)  Wt 69.854 kg  BMI 22.10 kg/m2  SpO2 98%   Physical Exam  Constitutional: He is oriented to person, place, and time. He appears well-developed and well-nourished. No distress.  Nontoxic/nonseptic appearing  HENT:  Head: Normocephalic and atraumatic.  Right Ear: Tympanic membrane and external ear normal.  Left Ear: External ear and ear canal normal. Tympanic membrane is retracted.  Nose: Mucosal edema present. No septal deviation or nasal septal hematoma.  Mouth/Throat: Uvula is midline and oropharynx is clear and moist.  Oropharynx clear. Patient tolerating secretions without difficulty.  Eyes: Conjunctivae and EOM are normal. No scleral  icterus.  Neck: Normal range of motion.  No nuchal rigidity or meningismus  Cardiovascular: Normal rate, regular rhythm and intact distal pulses.   Pulmonary/Chest: Effort normal. No respiratory distress. He has wheezes. He has no rales.  Faint expiratory wheezing in bilateral lower lung fields. No rales or rhonchi. Chest expansion symmetric.  Musculoskeletal: Normal range of motion.  Neurological: He is alert and oriented to person, place, and time. He exhibits normal muscle tone. Coordination normal.  Patient ambulatory with steady gait; moving all extremities.  Skin: Skin is warm and dry. No rash noted. He is not diaphoretic. No erythema. No pallor.  Psychiatric: He has a normal mood and affect. His behavior is normal.  Nursing note and vitals reviewed.    ED Course  Procedures (including critical care time)  DIAGNOSTIC STUDIES: Oxygen Saturation is 96% on RA, adequate by my interpretation.    COORDINATION OF CARE: 10:25 PM Discussed treatment plan with pt at bedside which includes CXR, breathing tx ,nasal spray and pt agreed to plan.    Imaging Review Dg Chest 2 View  06/07/2015  CLINICAL DATA:  Left thoracic back pain. Pleuritic chest pain. Cough. EXAM: CHEST  2 VIEW COMPARISON:  Radiograph 01/14/2014 FINDINGS: The cardiomediastinal contours are normal. The lungs are clear. Pulmonary vasculature is normal. No consolidation, pleural effusion, or pneumothorax. No acute osseous abnormalities are seen. IMPRESSION: No active cardiopulmonary disease. Electronically Signed   By: Rubye OaksMelanie  Ehinger M.D.   On: 06/07/2015 23:20     I have personally reviewed and evaluated these images as part of my medical decision-making.    MDM   Final diagnoses:  Acute sinusitis, recurrence not specified, unspecified location  2. Costochondritis   Patient complaining of symptoms of sinusitis. Mild to moderate symptoms of clear/yellow nasal discharge/congestion and scratchy throat with cough for  less than 10 days. Patient is afebrile. No concern for acute bacterial rhinosinusitis; likely viral in nature. He is also c/o mid back pain which is pleuritic. CXR negative for PNA. Likely costochondritis. Patient discharged with symptomatic treatment. Patient instructions given for warm saline nasal washes. Recommendations for follow-up with primary care physician.  Return precautions given at discharge. Patient discharged in good condition with no unaddressed concerns.  I personally performed the services described in this documentation, which was scribed in my presence. The recorded information has been reviewed and is accurate.    Filed Vitals:   06/07/15 2056 06/07/15 2336  BP: 110/73 116/80  Pulse: 73 69  Temp: 97.9 F (36.6 C)   TempSrc: Oral   Resp: 18 18  Height: 5\' 10"  (1.778 m)   Weight: 69.854 kg   SpO2: 96% 98%     Antony MaduraKelly Humes, PA-C 06/08/15 0031  Lorre NickAnthony Allen, MD 06/11/15 2319

## 2015-06-07 NOTE — ED Notes (Signed)
Pt states that he has had wheezing and nasal congestion today. Denies asthma hx. Alert and oriented.

## 2015-07-19 ENCOUNTER — Emergency Department (HOSPITAL_COMMUNITY)
Admission: EM | Admit: 2015-07-19 | Discharge: 2015-07-19 | Disposition: A | Discharge status: Home / Self Care | Payer: Medicare Other | Attending: Emergency Medicine | Admitting: Emergency Medicine

## 2015-07-19 ENCOUNTER — Encounter (HOSPITAL_COMMUNITY): Payer: Self-pay | Admitting: Emergency Medicine

## 2015-07-19 DIAGNOSIS — Z91010 Allergy to peanuts: Secondary | ICD-10-CM | POA: Diagnosis not present

## 2015-07-19 DIAGNOSIS — R21 Rash and other nonspecific skin eruption: Secondary | ICD-10-CM

## 2015-07-19 DIAGNOSIS — Z79899 Other long term (current) drug therapy: Secondary | ICD-10-CM | POA: Diagnosis not present

## 2015-07-19 DIAGNOSIS — Y929 Unspecified place or not applicable: Secondary | ICD-10-CM | POA: Diagnosis not present

## 2015-07-19 DIAGNOSIS — Y999 Unspecified external cause status: Secondary | ICD-10-CM | POA: Diagnosis not present

## 2015-07-19 DIAGNOSIS — Z791 Long term (current) use of non-steroidal anti-inflammatories (NSAID): Secondary | ICD-10-CM | POA: Diagnosis not present

## 2015-07-19 DIAGNOSIS — M542 Cervicalgia: Secondary | ICD-10-CM | POA: Diagnosis not present

## 2015-07-19 DIAGNOSIS — F17210 Nicotine dependence, cigarettes, uncomplicated: Secondary | ICD-10-CM | POA: Diagnosis not present

## 2015-07-19 DIAGNOSIS — X509XXA Other and unspecified overexertion or strenuous movements or postures, initial encounter: Secondary | ICD-10-CM | POA: Insufficient documentation

## 2015-07-19 DIAGNOSIS — Y9367 Activity, basketball: Secondary | ICD-10-CM | POA: Diagnosis not present

## 2015-07-19 MED ORDER — MELOXICAM 15 MG PO TABS: 15.0000 mg | ORAL_TABLET | Freq: Every day | ORAL | Status: DC

## 2015-07-19 MED ORDER — CYCLOBENZAPRINE HCL 10 MG PO TABS: 10.0000 mg | ORAL_TABLET | Freq: Two times a day (BID) | ORAL | Status: DC | PRN

## 2015-07-19 MED ORDER — KETOROLAC TROMETHAMINE 30 MG/ML IJ SOLN
30.0000 mg | Freq: Once | INTRAMUSCULAR | Status: AC
  Administered 2015-07-19: 30 mg via INTRAMUSCULAR
  Filled 2015-07-19: qty 1

## 2015-07-19 MED ORDER — TERBINAFINE HCL 1 % EX CREA: 1.0000 "application " | TOPICAL_CREAM | Freq: Two times a day (BID) | CUTANEOUS | Status: DC

## 2015-07-19 NOTE — ED Provider Notes (Signed)
CSN: [id number]     Arrival date & time 07/19/15  7829 History   None    Chief Complaint  Patient presents with  . Neck Pain     (Consider location/radiation/quality/duration/timing/severity/associated sxs/prior Treatment) HPI   Patient is a 20 year old male with no significant past medical history that presents the emergency department with neck pain. Patient states he was playing basketball yesterday and after he was finished began experiencing mild neck pain. He states he woke this morning with worsening neck pain that radiates into his left shoulder blade. The pain is constant, 9/10, worse with movement, he is not tried anything for pain. He's never had this pain before. He states this is different than a "crook" in the neck. He denies trauma, fever, chills, headache, visual changes, numbness, weakness. Patient has no history of cardiac disease and no family history of sudden cardiac death. Patient also has a rash for greater than 2 months. It's on his back, chest, neck, and bilateral upper extremities. He says his only itchy if he sweats a lot. He endorses mild scaling.  History reviewed. No pertinent past medical history. History reviewed. No pertinent past surgical history. Family History  Problem Relation Age of Onset  . Hypertension Other   . Diabetes Other   . Cancer Other    Social History  Substance Use Topics  . Smoking status: Current Every Day Smoker -- 1.00 packs/day    Types: Cigarettes  . Smokeless tobacco: Never Used     Comment: smokes black and milds  . Alcohol Use: 0.6 oz/week    1 Cans of beer per week     Comment: occ    Review of Systems  Constitutional: Negative for fever and chills.  HENT: Negative for sore throat and trouble swallowing.   Eyes: Negative for visual disturbance.  Respiratory: Negative for shortness of breath.   Cardiovascular: Negative for chest pain.  Gastrointestinal: Negative for nausea, vomiting and abdominal pain.   Musculoskeletal: Positive for myalgias and neck pain. Negative for back pain, joint swelling and neck stiffness.  Skin: Positive for rash.  Neurological: Negative for syncope, weakness, numbness and headaches.      Allergies  Amoxicillin; Peanut-containing drug products; Penicillins; and Shellfish allergy  Home Medications   Prior to Admission medications   Medication Sig Start Date End Date Taking? Authorizing Provider  acetaminophen-codeine (TYLENOL #3) 300-30 MG per tablet Take 1 tablet by mouth 2 (two) times daily as needed for moderate pain. Patient not taking: Reported on 11/15/2014 11/03/13   Tomasita Crumble, MD  benzonatate (TESSALON) 100 MG capsule Take 1 capsule (100 mg total) by mouth 3 (three) times daily as needed for cough. 06/07/15   Antony Madura, PA-C  cetirizine-pseudoephedrine (ZYRTEC-D) 5-120 MG tablet Take 1 tablet by mouth 2 (two) times daily. 06/07/15   Antony Madura, PA-C  chlorpheniramine-HYDROcodone (TUSSIONEX PENNKINETIC ER) 10-8 MG/5ML LQCR Take 5 mLs by mouth 2 (two) times daily. Patient not taking: Reported on 11/15/2014 01/14/14   Oswaldo Conroy, PA-C  cyclobenzaprine (FLEXERIL) 10 MG tablet Take 1 tablet (10 mg total) by mouth 2 (two) times daily as needed for muscle spasms. 07/19/15   Jerre Simon, PA  diphenhydrAMINE (BENADRYL) 25 MG tablet Take 1 tablet (25 mg total) by mouth every 6 (six) hours as needed for itching (Rash). Patient not taking: Reported on 03/08/2015 11/15/14   Antony Madura, PA-C  meloxicam (MOBIC) 15 MG tablet Take 1 tablet (15 mg total) by mouth daily. 07/19/15   Jerre Simon,  PA  naproxen (NAPROSYN) 500 MG tablet Take 1 tablet (500 mg total) by mouth 2 (two) times daily. 06/07/15   Antony MaduraKelly Humes, PA-C  terbinafine (LAMISIL AT) 1 % cream Apply 1 application topically 2 (two) times daily. 07/19/15   [name] Focht, PA   BP 112/64 mmHg  Pulse 57  Temp(Src) 98.1 F (36.7 C) (Oral)  Resp 14  SpO2 97% Physical Exam  Constitutional: He appears  well-developed and well-nourished. No distress.  HENT:  Head: Normocephalic and atraumatic.  Eyes: Conjunctivae are normal.  Neck:  No step-offs or deformities, no edema or ecchymosis, no midline cervical tenderness, no midline thoracic tenderness, limited range of motion due to pain, TTP 2 paraspinal muscles and trapezius bilaterally left > right, TTP 2 inferior left scapula. Pain reproducible with palpation  Cardiovascular: Normal rate, regular rhythm and normal heart sounds.   Pulmonary/Chest: Effort normal and breath sounds normal. No respiratory distress. He has no wheezes. He has no rales.  Musculoskeletal: He exhibits no edema.  Full ROM of bilaterally upper and lower extremities, no pain with ROM, strength 5/5 of bilaterally upper and lower extremities, sensation intact to light touch of bilateral upper and lower extremities.  Neurological: He is alert. Coordination normal.  Skin: Skin is warm and dry. He is not diaphoretic.  Hypopigmented patches to the chest, back, neck and bilaterally arms with intermittent scaling   Psychiatric: He has a normal mood and affect. His behavior is normal.    ED Course  Procedures (including critical care time) Labs Review Labs Reviewed - No data to display  Imaging Review No results found. I have personally reviewed and evaluated these images and lab results as part of my medical decision-making.   EKG Interpretation None      MDM   Final diagnoses:  Neck pain  Rash   Patient with neck pain.  No neurological deficits and normal neuro exam.  Patient can move neck but states it is painful.  No numbness/tingling or weakness. No fever, night sweats, weight loss, h/o cancer.  Patient afebrile, VSS, no AMS, no headache, without neck rigidity, less concerning for meningitis. Likely musculoskeletal in nature with history of playing basketball yesterday. RICE protocol and pain medicine indicated and discussed with patient. Discussed strict return  precautions. Patient was understanding the discharge instructions.  Patient with a rash that is likely tinea versicolor. Will discharge with Lamisil cream. Instructed patient to follow up with his primary care provider within 2 days to have his neck pain and rash reevaluated. Instructed patient to discontinue cream if he experiences hives or excessive itching.     Jerre SimonJessica L Focht, PA 07/19/15 [id number]  Dione Boozeavid Glick, MD 07/19/15 [id number]  Dione Boozeavid Glick, MD 07/19/15 [phone]

## 2015-07-19 NOTE — Discharge Instructions (Signed)
Follow-up with your primary care provider, Dr. Greggory StallionGeorge, within 2 days to have your neck pain and rash reevaluated.  Return to the emergency department if he experienced numbness/tingling, weakness, chest pain, shortness of breath, fever, chills, nausea, vomiting.  Musculoskeletal Pain Musculoskeletal pain is muscle and boney aches and pains. These pains can occur in any part of the body. Your caregiver may treat you without knowing the cause of the pain. They may treat you if blood or urine tests, X-rays, and other tests were normal.  CAUSES There is often not a definite cause or reason for these pains. These pains may be caused by a type of germ (virus). The discomfort may also come from overuse. Overuse includes working out too hard when your body is not fit. Boney aches also come from weather changes. Bone is sensitive to atmospheric pressure changes. HOME CARE INSTRUCTIONS   Ask when your test results will be ready. Make sure you get your test results.  Only take over-the-counter or prescription medicines for pain, discomfort, or fever as directed by your caregiver. If you were given medications for your condition, do not drive, operate machinery or power tools, or sign legal documents for 24 hours. Do not drink alcohol. Do not take sleeping pills or other medications that may interfere with treatment.  Continue all activities unless the activities cause more pain. When the pain lessens, slowly resume normal activities. Gradually increase the intensity and duration of the activities or exercise.  During periods of severe pain, bed rest may be helpful. Lay or sit in any position that is comfortable.  Putting ice on the injured area.  Put ice in a bag.  Place a towel between your skin and the bag.  Leave the ice on for 15 to 20 minutes, 3 to 4 times a day.  Follow up with your caregiver for continued problems and no reason can be found for the pain. If the pain becomes worse or does not go  away, it may be necessary to repeat tests or do additional testing. Your caregiver may need to look further for a possible cause. SEEK IMMEDIATE MEDICAL CARE IF:  You have pain that is getting worse and is not relieved by medications.  You develop chest pain that is associated with shortness or breath, sweating, feeling sick to your stomach (nauseous), or throw up (vomit).  Your pain becomes localized to the abdomen.  You develop any new symptoms that seem different or that concern you. MAKE SURE YOU:   Understand these instructions.  Will watch your condition.  Will get help right away if you are not doing well or get worse.   This information is not intended to replace advice given to you by your health care provider. Make sure you discuss any questions you have with your health care provider.   Document Released: 02/06/2005 Document Revised: 05/01/2011 Document Reviewed: 10/11/2012 Elsevier Interactive Patient Education [employer]

## 2015-07-19 NOTE — ED Notes (Signed)
Made Shanda BumpsJessica PA aware of patient's request for something for pain before he leaves.

## 2015-07-19 NOTE — ED Notes (Signed)
Pt reports to ER c/o neck pain that radiates to left shoulder; pt states he played basketball yesterday and could have pulled a muscle; pt awoke this morning with the pain and decided to get checked out; denies fevers, N/V/D, chills, urinary symptoms; NAD noted.

## 2015-08-24 ENCOUNTER — Encounter (HOSPITAL_COMMUNITY): Payer: Self-pay | Admitting: Emergency Medicine

## 2015-08-24 ENCOUNTER — Emergency Department (HOSPITAL_COMMUNITY)
Admission: EM | Admit: 2015-08-24 | Discharge: 2015-08-24 | Disposition: A | Discharge status: Home / Self Care | Payer: Medicare Other | Attending: Emergency Medicine | Admitting: Emergency Medicine

## 2015-08-24 DIAGNOSIS — H578 Other specified disorders of eye and adnexa: Secondary | ICD-10-CM | POA: Diagnosis present

## 2015-08-24 DIAGNOSIS — Z91010 Allergy to peanuts: Secondary | ICD-10-CM | POA: Diagnosis not present

## 2015-08-24 DIAGNOSIS — Z91013 Allergy to seafood: Secondary | ICD-10-CM | POA: Diagnosis not present

## 2015-08-24 DIAGNOSIS — Z791 Long term (current) use of non-steroidal anti-inflammatories (NSAID): Secondary | ICD-10-CM | POA: Insufficient documentation

## 2015-08-24 DIAGNOSIS — Z79899 Other long term (current) drug therapy: Secondary | ICD-10-CM | POA: Insufficient documentation

## 2015-08-24 DIAGNOSIS — F17210 Nicotine dependence, cigarettes, uncomplicated: Secondary | ICD-10-CM | POA: Diagnosis not present

## 2015-08-24 DIAGNOSIS — H109 Unspecified conjunctivitis: Secondary | ICD-10-CM | POA: Diagnosis not present

## 2015-08-24 MED ORDER — TOBRAMYCIN 0.3 % OP SOLN: 2.0000 [drp] | OPHTHALMIC | Status: DC

## 2015-08-24 NOTE — ED Notes (Signed)
Per patient, his left eye is pink with some clear drainage.  His eye is watery.  Denies any trauma or injury.  Has some blurred vision in left eye.

## 2015-08-24 NOTE — ED Provider Notes (Signed)
CSN: [id number]     Arrival date & time 08/24/15  1738 History   First MD Initiated Contact with Patient 08/24/15 1912     Chief Complaint  Patient presents with  . Conjunctivitis     (Consider location/radiation/quality/duration/timing/severity/associated sxs/prior Treatment) Patient is a 20 y.o. male presenting with conjunctivitis. The history is provided by the patient. No language interpreter was used.  Conjunctivitis This is a new problem. The current episode started yesterday. The problem occurs constantly. The problem has been gradually worsening. Pertinent negatives include no fever or sore throat. Nothing aggravates the symptoms. He has tried nothing for the symptoms. The treatment provided moderate relief.  Pt complains of redness and drainage from his left eye  History reviewed. No pertinent past medical history. History reviewed. No pertinent past surgical history. Family History  Problem Relation Age of Onset  . Hypertension Other   . Diabetes Other   . Cancer Other    Social History  Substance Use Topics  . Smoking status: Current Every Day Smoker -- 1.00 packs/day    Types: Cigarettes  . Smokeless tobacco: Never Used     Comment: smokes black and milds  . Alcohol Use: 0.6 oz/week    1 Cans of beer per week     Comment: occ    Review of Systems  Constitutional: Negative for fever.  HENT: Negative for sore throat.   All other systems reviewed and are negative.     Allergies  Amoxicillin; Peanut-containing drug products; Penicillins; and Shellfish allergy  Home Medications   Prior to Admission medications   Medication Sig Start Date End Date Taking? Authorizing Provider  acetaminophen-codeine (TYLENOL #3) 300-30 MG per tablet Take 1 tablet by mouth 2 (two) times daily as needed for moderate pain. Patient not taking: Reported on 11/15/2014 11/03/13   Tomasita CrumbleAdeleke Oni, MD  benzonatate (TESSALON) 100 MG capsule Take 1 capsule (100 mg total) by mouth 3 (three) times  daily as needed for cough. 06/07/15   Antony MaduraKelly Humes, PA-C  cetirizine-pseudoephedrine (ZYRTEC-D) 5-120 MG tablet Take 1 tablet by mouth 2 (two) times daily. 06/07/15   Antony MaduraKelly Humes, PA-C  chlorpheniramine-HYDROcodone (TUSSIONEX PENNKINETIC ER) 10-8 MG/5ML LQCR Take 5 mLs by mouth 2 (two) times daily. Patient not taking: Reported on 11/15/2014 01/14/14   Oswaldo ConroyVictoria Creech, PA-C  cyclobenzaprine (FLEXERIL) 10 MG tablet Take 1 tablet (10 mg total) by mouth 2 (two) times daily as needed for muscle spasms. 07/19/15   Jerre SimonJessica L Focht, PA  diphenhydrAMINE (BENADRYL) 25 MG tablet Take 1 tablet (25 mg total) by mouth every 6 (six) hours as needed for itching (Rash). Patient not taking: Reported on 03/08/2015 11/15/14   Antony MaduraKelly Humes, PA-C  meloxicam (MOBIC) 15 MG tablet Take 1 tablet (15 mg total) by mouth daily. 07/19/15   Jerre SimonJessica L Focht, PA  naproxen (NAPROSYN) 500 MG tablet Take 1 tablet (500 mg total) by mouth 2 (two) times daily. 06/07/15   Antony MaduraKelly Humes, PA-C  terbinafine (LAMISIL AT) 1 % cream Apply 1 application topically 2 (two) times daily. 07/19/15   [name] Focht, PA   BP 96/62 mmHg  Pulse 65  Temp(Src) 98.1 F (36.7 C) (Oral)  Resp 16  Ht 5\' 9"  (1.753 m)  Wt 69.854 kg  BMI 22.73 kg/m2  SpO2 100% Physical Exam  Constitutional: He is oriented to person, place, and time. He appears well-developed and well-nourished.  HENT:  Head: Normocephalic.  Eyes: EOM are normal. Pupils are equal, round, and reactive to light. Left eye exhibits  discharge.  Injected,  Neck: Normal range of motion.  Pulmonary/Chest: Effort normal.  Abdominal: He exhibits no distension.  Musculoskeletal: Normal range of motion.  Neurological: He is alert and oriented to person, place, and time.  Psychiatric: He has a normal mood and affect.  Nursing note and vitals reviewed.   ED Course  Procedures (including critical care time) Labs Review Labs Reviewed - No data to display  Imaging Review No results found. I have  personally reviewed and evaluated these images and lab results as part of my medical decision-making.   EKG Interpretation None      MDM   Final diagnoses:  Conjunctivitis of left eye    Tobrex opth sol An After Visit Summary was printed and given to the patient.    Lonia SkinnerLeslie K Lake PanoramaSofia, PA-C 08/24/15 1930  Blane OharaJoshua Zavitz, MD 08/24/15 2215

## 2015-08-24 NOTE — Discharge Instructions (Signed)

## 2016-07-05 ENCOUNTER — Encounter (HOSPITAL_COMMUNITY): Payer: Self-pay | Admitting: Emergency Medicine

## 2016-07-05 ENCOUNTER — Emergency Department (HOSPITAL_COMMUNITY)
Admission: EM | Admit: 2016-07-05 | Discharge: 2016-07-05 | Disposition: A | Discharge status: Home / Self Care | Payer: Medicare Other | Attending: Emergency Medicine | Admitting: Emergency Medicine

## 2016-07-05 ENCOUNTER — Emergency Department (HOSPITAL_COMMUNITY): Payer: Medicare Other

## 2016-07-05 DIAGNOSIS — S6991XA Unspecified injury of right wrist, hand and finger(s), initial encounter: Secondary | ICD-10-CM

## 2016-07-05 DIAGNOSIS — Y92410 Unspecified street and highway as the place of occurrence of the external cause: Secondary | ICD-10-CM | POA: Diagnosis not present

## 2016-07-05 DIAGNOSIS — Y939 Activity, unspecified: Secondary | ICD-10-CM | POA: Diagnosis not present

## 2016-07-05 DIAGNOSIS — Z91010 Allergy to peanuts: Secondary | ICD-10-CM | POA: Diagnosis not present

## 2016-07-05 DIAGNOSIS — M79644 Pain in right finger(s): Secondary | ICD-10-CM | POA: Diagnosis not present

## 2016-07-05 DIAGNOSIS — Y999 Unspecified external cause status: Secondary | ICD-10-CM | POA: Insufficient documentation

## 2016-07-05 DIAGNOSIS — F17210 Nicotine dependence, cigarettes, uncomplicated: Secondary | ICD-10-CM | POA: Insufficient documentation

## 2016-07-05 DIAGNOSIS — Z79899 Other long term (current) drug therapy: Secondary | ICD-10-CM | POA: Diagnosis not present

## 2016-07-05 DIAGNOSIS — M7989 Other specified soft tissue disorders: Secondary | ICD-10-CM | POA: Diagnosis not present
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
## 2016-07-05 MED ORDER — METHOCARBAMOL 500 MG PO TABS: 500.0000 mg | ORAL_TABLET | Freq: Four times a day (QID) | ORAL | 0 refills | Status: AC | PRN

## 2016-07-05 MED ORDER — ACETAMINOPHEN 500 MG PO TABS
1000.0000 mg | ORAL_TABLET | Freq: Once | ORAL | Status: AC
  Administered 2016-07-05: 1000 mg via ORAL
  Filled 2016-07-05: qty 2

## 2016-07-05 MED ORDER — IBUPROFEN 200 MG PO TABS
600.0000 mg | ORAL_TABLET | Freq: Once | ORAL | Status: AC
  Administered 2016-07-05: 600 mg via ORAL
  Filled 2016-07-05: qty 3
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
## 2017-03-01 ENCOUNTER — Other Ambulatory Visit: Payer: Self-pay

## 2017-03-01 ENCOUNTER — Encounter (HOSPITAL_COMMUNITY): Payer: Self-pay

## 2017-03-01 ENCOUNTER — Emergency Department (HOSPITAL_COMMUNITY)
Admission: EM | Admit: 2017-03-01 | Discharge: 2017-03-01 | Disposition: A | Discharge status: Home / Self Care | Payer: Medicare Other | Attending: Emergency Medicine | Admitting: Emergency Medicine

## 2017-03-01 DIAGNOSIS — Z202 Contact with and (suspected) exposure to infections with a predominantly sexual mode of transmission: Secondary | ICD-10-CM | POA: Insufficient documentation

## 2017-03-01 DIAGNOSIS — F17210 Nicotine dependence, cigarettes, uncomplicated: Secondary | ICD-10-CM | POA: Insufficient documentation

## 2017-03-01 DIAGNOSIS — Z79899 Other long term (current) drug therapy: Secondary | ICD-10-CM | POA: Insufficient documentation

## 2017-03-01 DIAGNOSIS — Z91010 Allergy to peanuts: Secondary | ICD-10-CM | POA: Insufficient documentation

## 2017-03-01 DIAGNOSIS — R369 Urethral discharge, unspecified: Secondary | ICD-10-CM | POA: Insufficient documentation

## 2017-03-01 LAB — URINALYSIS, ROUTINE W REFLEX MICROSCOPIC
Bilirubin Urine: NEGATIVE
Glucose, UA: NEGATIVE mg/dL
Hgb urine dipstick: NEGATIVE
Ketones, ur: 5 mg/dL — AB
Leukocytes, UA: NEGATIVE
Nitrite: NEGATIVE
Protein, ur: NEGATIVE mg/dL
Specific Gravity, Urine: 1.011 (ref 1.005–1.030)
pH: 6 (ref 5.0–8.0)

## 2017-03-01 MED ORDER — AZITHROMYCIN 250 MG PO TABS
1000.0000 mg | ORAL_TABLET | Freq: Once | ORAL | Status: AC
  Administered 2017-03-01: 1000 mg via ORAL
  Filled 2017-03-01: qty 4

## 2017-03-01 MED ORDER — GENTAMICIN SULFATE 40 MG/ML IJ SOLN
240.0000 mg | Freq: Once | INTRAMUSCULAR | Status: AC
  Administered 2017-03-01: 240 mg via INTRAMUSCULAR
  Filled 2017-03-01 (×2): qty 6

## 2017-03-01 MED ORDER — CEFTRIAXONE SODIUM 250 MG IJ SOLR: 250.0000 mg | Freq: Once | INTRAMUSCULAR | Status: DC

## 2017-03-01 NOTE — Discharge Instructions (Signed)
Please read and follow all provided instructions.  Your diagnoses today include:  1. STD exposure     Tests performed today include: Test for gonorrhea and chlamydia. You will be notified by telephone if you have a positive result. Vital signs. See below for your results today.   Medications:  You were treated for chlamydia (1 gram azithromycin pills) and gonorrhea (250mg  rocephin shot).  Home care instructions:  Read educational materials contained in this packet and follow any instructions provided.   You should tell your partners about your infection and avoid having sex for one week to allow time for the medicine to work.  Follow-up instructions: You [facility] to be tested for HIV, syphilis, and hepatitis -- all of which can be transmitted by sexual contact. We do not routinely screen for these in the Emergency Department.  STD Testing: [facility] [place] [address], [place], phone [phone] or [phone]   Monday - Friday, call for an appointment [facility] [place] [address], [place], phone [phone] or [phone]  Monday - Friday, call for an appointment  Return instructions:  Please return to the Emergency Department if you experience worsening symptoms.  Please return if you have any other emergent concerns.  Additional Information:  Your vital signs today were: BP 127/70    Pulse 78    Temp (!) 97.5 F (36.4 C) (Oral)    Resp 18    SpO2 99%  If your blood pressure (BP) was elevated above 135/85 this visit, please have this repeated by your doctor within one month. --------------

## 2017-03-01 NOTE — ED Triage Notes (Signed)
Pt reports dysuria w/o discharge. Pt reports his GF recently tested positive for Gonorrhea. Pt A+OX4, NAD.

## 2017-03-01 NOTE — ED Provider Notes (Signed)
Philip Ramos Provider Note   CSN: [id number] Arrival date & time: 03/01/17  0546     History   Chief Complaint Chief Complaint  Patient presents with  . Exposure to STD    HPI Philip Ramos is a [age] y.o. male.  HPI  22 y.o. male, presents to the Emergency Department today due to STD exposure. Girlfriend with positive gonorrhea test. Notes dysuria with mild discharge. No scrotal pain. Denies pain currently. No fevers. No N/V. No abdominal pain. No meds PTA. No other symptoms noted    History reviewed. No pertinent past medical history.  There are no active problems to display for this patient.   History reviewed. No pertinent surgical history.     Home Medications    Prior to Admission medications   Medication Sig Start Date End Date Taking? Authorizing Provider  acetaminophen-codeine (TYLENOL #3) 300-30 MG per tablet Take 1 tablet by mouth 2 (two) times daily as needed for moderate pain. Patient not taking: Reported on 11/15/2014 11/03/13   Philip Crumbleni, [name], MD  benzonatate (TESSALON) 100 MG capsule Take 1 capsule (100 mg total) by mouth 3 (three) times daily as needed for cough. 06/07/15   Philip MaduraHumes, [name], PA-C  cetirizine-pseudoephedrine (ZYRTEC-D) 5-120 MG tablet Take 1 tablet by mouth 2 (two) times daily. 06/07/15   Philip MaduraHumes, [name], PA-C  chlorpheniramine-HYDROcodone (TUSSIONEX PENNKINETIC ER) 10-8 MG/5ML LQCR Take 5 mLs by mouth 2 (two) times daily. Patient not taking: Reported on 11/15/2014 01/14/14   Philip Conroyreech, [name], PA-C  cyclobenzaprine (FLEXERIL) 10 MG tablet Take 1 tablet (10 mg total) by mouth 2 (two) times daily as needed for muscle spasms. 07/19/15   Focht, Joyce CopaJessica L, PA  diphenhydrAMINE (BENADRYL) 25 MG tablet Take 1 tablet (25 mg total) by mouth every 6 (six) hours as needed for itching (Rash). Patient not taking: Reported on 03/08/2015 11/15/14   Philip MaduraHumes, [name], PA-C  meloxicam (MOBIC) 15 MG tablet Take 1 tablet (15 mg total) by mouth  daily. 07/19/15   Focht, Joyce CopaJessica L, PA  naproxen (NAPROSYN) 500 MG tablet Take 1 tablet (500 mg total) by mouth 2 (two) times daily. 06/07/15   Philip MaduraHumes, [name], PA-C  terbinafine (LAMISIL AT) 1 % cream Apply 1 application topically 2 (two) times daily. 07/19/15   Focht, Joyce CopaJessica L, PA  tobramycin (TOBREX) 0.3 % ophthalmic solution Place 2 drops into the left eye every 4 (four) hours. 08/24/15   Philip AreasSofia, Leslie K, PA-C    Family History Family History  Problem Relation Age of Onset  . Hypertension Other   . Diabetes Other   . Cancer Other   . Cancer Mother   . Diabetes Mother   . Cancer Father   . Diabetes Father     Social History Social History   Tobacco Use  . Smoking status: Current Every Day Smoker    Packs/day: 1.00    Types: Cigarettes  . Smokeless tobacco: Never Used  . Tobacco comment: smokes black and milds  Substance Use Topics  . Alcohol use: Yes    Alcohol/week: 0.6 oz    Types: 1 Cans of beer per week    Comment: occ  . Drug use: No     Allergies   Amoxicillin; Peanut-containing drug products; Penicillins; and Shellfish allergy   Review of Systems Review of Systems ROS reviewed and all are negative for acute change except as noted in the HPI  Physical Exam Updated Vital Signs BP 127/70   Pulse 78   Temp (!)  97.5 F (36.4 C) (Oral)   Resp 18   SpO2 99%   Physical Exam  Constitutional: He is oriented to person, place, and time. He appears well-developed and well-nourished. No distress.  HENT:  Head: Normocephalic and atraumatic.  Right Ear: Tympanic membrane, external ear and ear canal normal.  Left Ear: Tympanic membrane, external ear and ear canal normal.  Nose: Nose normal.  Mouth/Throat: Uvula is midline, oropharynx is clear and moist and mucous membranes are normal. No trismus in the jaw. No oropharyngeal exudate, posterior oropharyngeal erythema or tonsillar abscesses.  Eyes: EOM are normal. Pupils are equal, round, and reactive to light.  Neck:  Normal range of motion. Neck supple. No tracheal deviation present.  Cardiovascular: Normal rate, regular rhythm, S1 normal, S2 normal, normal heart sounds, intact distal pulses and normal pulses.  Pulmonary/Chest: Effort normal and breath sounds normal. No respiratory distress. He has no decreased breath sounds. He has no wheezes. He has no rhonchi. He has no rales.  Abdominal: Normal appearance and bowel sounds are normal. There is no tenderness.  Genitourinary:  Genitourinary Comments: Mild penile discharge. No scrotal tenderness. No erythema  Musculoskeletal: Normal range of motion.  Neurological: He is alert and oriented to person, place, and time.  Skin: Skin is warm and dry.  Psychiatric: He has a normal mood and affect. His speech is normal and behavior is normal. Thought content normal.  Nursing note and vitals reviewed.    ED Treatments / Results  Labs (all labs ordered are listed, but only abnormal results are displayed) Labs Reviewed  URINALYSIS, ROUTINE W REFLEX MICROSCOPIC - Abnormal; Notable for the following components:      Result Value   Color, Urine STRAW (*)    Ketones, ur 5 (*)    All other components within normal limits  GC/CHLAMYDIA PROBE AMP ([place]) NOT AT [facility]    EKG  EKG Interpretation None       Radiology No results found.  Procedures Procedures (including critical care time)  Medications Ordered in ED Medications  azithromycin (ZITHROMAX) tablet 1,000 mg (not administered)  cefTRIAXone (ROCEPHIN) injection 250 mg (not administered)     Initial Impression / Assessment and Plan / ED Course  I have reviewed the triage vital signs and the nursing notes.  Pertinent labs & imaging results that were available during my care of the patient were reviewed by me and considered in my medical decision making (see chart for details).  Final Clinical Impressions(s) / ED Diagnoses  {I have reviewed and evaluated the relevant laboratory values.    {I have reviewed the relevant previous healthcare records.  {I obtained HPI from historian.   ED Course:  Assessment: Pt is a 22 y.o. male presents to the Emergency Department today due to STD exposure. Girlfriend with positive gonorrhea test. Notes dysuria with mild discharge. No scrotal pain. Denies pain currently. No fevers. No N/V. No abdominal pain. No meds PTA. On exam, pt in NAD. Nontoxic/nonseptic appearing. VSS. Afebrile. GU exam unremarkable. Labs unremarkable. GC obtained. Given Gentamicin/Azithro. Plan is to DC home with follow up to PCP. At time of discharge, Patient is in no acute distress. Vital Signs are stable. Patient is able to ambulate. Patient able to tolerate PO.   Disposition/Plan:  DC Home Additional Verbal discharge instructions given and discussed with patient.  Pt Instructed to f/u with PCP in the next week for evaluation and treatment of symptoms. Return precautions given Pt acknowledges and agrees with plan  Supervising Physician  Rolland Porter, MD  Final diagnoses:  STD exposure    ED Discharge Orders    None       Audry Pili, PA-C 03/01/17 [id number]    Rolland Porter, MD 03/06/17 [phone]

## 2017-03-01 NOTE — ED Notes (Signed)
Bed: WTR7 Expected date:  Expected time:  Means of arrival:  Comments: 

## 2017-03-02 LAB — GC/CHLAMYDIA PROBE AMP (~~LOC~~) NOT AT ARMC
Chlamydia: NEGATIVE
Neisseria Gonorrhea: NEGATIVE

## 2017-03-07 ENCOUNTER — Encounter (HOSPITAL_COMMUNITY): Payer: Self-pay | Admitting: Emergency Medicine

## 2017-03-07 ENCOUNTER — Other Ambulatory Visit: Payer: Self-pay

## 2017-03-07 DIAGNOSIS — M542 Cervicalgia: Secondary | ICD-10-CM | POA: Insufficient documentation

## 2017-03-07 DIAGNOSIS — R0981 Nasal congestion: Secondary | ICD-10-CM | POA: Insufficient documentation

## 2017-03-07 DIAGNOSIS — R05 Cough: Secondary | ICD-10-CM | POA: Insufficient documentation

## 2017-03-07 DIAGNOSIS — R509 Fever, unspecified: Secondary | ICD-10-CM | POA: Insufficient documentation

## 2017-03-07 DIAGNOSIS — B349 Viral infection, unspecified: Secondary | ICD-10-CM | POA: Insufficient documentation

## 2017-03-07 DIAGNOSIS — F17210 Nicotine dependence, cigarettes, uncomplicated: Secondary | ICD-10-CM | POA: Insufficient documentation

## 2017-03-07 MED ORDER — ACETAMINOPHEN 325 MG PO TABS
650.0000 mg | ORAL_TABLET | Freq: Once | ORAL | Status: AC | PRN
  Administered 2017-03-07: 650 mg via ORAL
  Filled 2017-03-07: qty 2

## 2017-03-07 NOTE — ED Triage Notes (Signed)
Pt states he has been sick since Monday  Pt is c/o headache, neck pain, hurting behind his eye, cough, runny nose   Pt has a fever here but states he has not had one until now

## 2017-03-08 ENCOUNTER — Emergency Department (HOSPITAL_COMMUNITY)
Admission: EM | Admit: 2017-03-08 | Discharge: 2017-03-08 | Disposition: A | Discharge status: Home / Self Care | Payer: Medicare Other | Attending: Emergency Medicine | Admitting: Emergency Medicine

## 2017-03-08 ENCOUNTER — Encounter (HOSPITAL_COMMUNITY): Payer: Self-pay | Admitting: Emergency Medicine

## 2017-03-08 DIAGNOSIS — B349 Viral infection, unspecified: Secondary | ICD-10-CM

## 2017-03-08 MED ORDER — IBUPROFEN 800 MG PO TABS: 800.0000 mg | ORAL_TABLET | Freq: Three times a day (TID) | ORAL | 0 refills | Status: DC

## 2017-03-08 MED ORDER — ACETAMINOPHEN 500 MG PO TABS
1000.0000 mg | ORAL_TABLET | Freq: Once | ORAL | Status: AC
  Administered 2017-03-08: 1000 mg via ORAL
  Filled 2017-03-08: qty 2

## 2017-03-08 MED ORDER — IBUPROFEN 800 MG PO TABS
800.0000 mg | ORAL_TABLET | Freq: Once | ORAL | Status: AC
  Administered 2017-03-08: 800 mg via ORAL
  Filled 2017-03-08: qty 1

## 2017-03-08 NOTE — ED Notes (Signed)
Po challenged per provider.

## 2017-03-08 NOTE — ED Provider Notes (Signed)
[facility]-EMERGENCY DEPT Provider Note   CSN: [id number] Arrival date & time: 03/07/17  2238     History   Chief Complaint Chief Complaint  Patient presents with  . flu like symptoms    HPI Philip Ramos is a 23 y.o. male.  The history is provided by the patient. No language interpreter was used.  URI   This is a new problem. The current episode started more than 2 days ago. The problem has not changed since onset.The maximum temperature recorded prior to his arrival was 100 to 100.9 F. The fever has been present for 1 to 2 days. Associated symptoms include rhinorrhea. Pertinent negatives include no chest pain, no diarrhea, no nausea, no vomiting, no dysuria, no congestion, no sneezing, no sore throat, no swollen glands, no joint pain, no joint swelling, no cough, no rash and no wheezing. Associated symptoms comments: Clear and colorless. The treatment provided no relief.    History reviewed. No pertinent past medical history.  There are no active problems to display for this patient.   History reviewed. No pertinent surgical history.     Home Medications    Prior to Admission medications   Medication Sig Start Date End Date Taking? Authorizing Provider  acetaminophen-codeine (TYLENOL #3) 300-30 MG per tablet Take 1 tablet by mouth 2 (two) times daily as needed for moderate pain. Patient not taking: Reported on 11/15/2014 11/03/13   Tomasita Crumble, MD  benzonatate (TESSALON) 100 MG capsule Take 1 capsule (100 mg total) by mouth 3 (three) times daily as needed for cough. Patient not taking: Reported on 03/08/2017 06/07/15   Antony Madura, PA-C  cetirizine-pseudoephedrine (ZYRTEC-D) 5-120 MG tablet Take 1 tablet by mouth 2 (two) times daily. Patient not taking: Reported on 03/08/2017 06/07/15   Antony Madura, PA-C  chlorpheniramine-HYDROcodone [facility] PENNKINETIC ER) 10-8 MG/5ML LQCR Take 5 mLs by mouth 2 (two) times daily. Patient not taking: Reported on  11/15/2014 01/14/14   Oswaldo Conroy, PA-C  cyclobenzaprine (FLEXERIL) 10 MG tablet Take 1 tablet (10 mg total) by mouth 2 (two) times daily as needed for muscle spasms. Patient not taking: Reported on 03/08/2017 07/19/15   Jerre Simon, PA  diphenhydrAMINE (BENADRYL) 25 MG tablet Take 1 tablet (25 mg total) by mouth every 6 (six) hours as needed for itching (Rash). Patient not taking: Reported on 03/08/2015 11/15/14   Antony Madura, PA-C  meloxicam (MOBIC) 15 MG tablet Take 1 tablet (15 mg total) by mouth daily. Patient not taking: Reported on 03/08/2017 07/19/15   Jerre Simon, PA  naproxen (NAPROSYN) 500 MG tablet Take 1 tablet (500 mg total) by mouth 2 (two) times daily. Patient not taking: Reported on 03/08/2017 06/07/15   Antony Madura, PA-C  terbinafine (LAMISIL AT) 1 % cream Apply 1 application topically 2 (two) times daily. Patient not taking: Reported on 03/08/2017 07/19/15   [name], PA  tobramycin (TOBREX) 0.3 % ophthalmic solution Place 2 drops into the left eye every 4 (four) hours. Patient not taking: Reported on 03/08/2017 08/24/15   Osie Cheeks    Family History Family History  Problem Relation Age of Onset  . Hypertension Other   . Diabetes Other   . Cancer Other   . Cancer Mother   . Diabetes Mother   . Cancer Father   . Diabetes Father     Social History Social History   Tobacco Use  . Smoking status: Current Every Day Smoker    Packs/day: 1.00  Types: Cigarettes  . Smokeless tobacco: Never Used  . Tobacco comment: smokes black and milds  Substance Use Topics  . Alcohol use: Yes    Alcohol/week: 0.6 oz    Types: 1 Cans of beer per week    Comment: occ  . Drug use: No     Allergies   Amoxicillin; Peanut-containing drug products; Penicillins; and Shellfish allergy   Review of Systems Review of Systems  Constitutional: Positive for fever. Negative for chills.  HENT: Positive for rhinorrhea. Negative for congestion, drooling, facial  swelling, sneezing, sore throat, trouble swallowing and voice change.   Eyes: Negative for photophobia and visual disturbance.  Respiratory: Negative for cough, shortness of breath and wheezing.   Cardiovascular: Negative for chest pain, palpitations and leg swelling.  Gastrointestinal: Negative for diarrhea, nausea and vomiting.  Genitourinary: Negative for dysuria, penile pain and scrotal swelling.  Musculoskeletal: Positive for myalgias. Negative for arthralgias, back pain, gait problem, joint pain and neck stiffness.  Skin: Negative for rash.  Neurological: Negative for dizziness, facial asymmetry and numbness.  All other systems reviewed and are negative.    Physical Exam Updated Vital Signs BP 129/70 (BP Location: Left Arm)   Pulse 69   Temp (!) 101.4 F (38.6 C) (Oral)   Resp 20   SpO2 98%   Physical Exam  Constitutional: He is oriented to person, place, and time. He appears well-developed and well-nourished. No distress.  HENT:  Head: Normocephalic and atraumatic.  Mouth/Throat: Oropharynx is clear and moist. No oropharyngeal exudate.  No sinus tenderness  Eyes: Conjunctivae and EOM are normal. Pupils are equal, round, and reactive to light.  No proptosis sitting in the room comfortably with lights on. Disks sharp B  Neck: Normal range of motion and full passive range of motion without pain. Neck supple. No JVD present. No tracheal tenderness, no spinous process tenderness and no muscular tenderness present. No neck rigidity. No tracheal deviation, no edema, no erythema and normal range of motion present.  Cardiovascular: Normal rate, regular rhythm, normal heart sounds and intact distal pulses.  Pulmonary/Chest: Effort normal and breath sounds normal. No stridor. No respiratory distress. He has no wheezes. He has no rales.  Abdominal: Soft. Bowel sounds are normal. He exhibits no mass. There is no tenderness. There is no rebound and no guarding. No hernia.  Musculoskeletal:  Normal range of motion. He exhibits no tenderness.  Lymphadenopathy:    He has no cervical adenopathy.  Neurological: He is alert and oriented to person, place, and time. He displays normal reflexes. No cranial nerve deficit.  Intact cognition  Skin: Skin is warm and dry. Capillary refill takes less than 2 seconds.     ED Treatments / Results   Vitals:   03/07/17 2315 03/08/17 0535  BP: 130/77 129/70  Pulse: (!) 106 69  Resp: 20 20  Temp: (!) 101.4 F (38.6 C)   SpO2: 96% 98%     Procedures Procedures (including critical care time)  Medications Ordered in ED Medications  acetaminophen (TYLENOL) tablet 1,000 mg (not administered)  acetaminophen (TYLENOL) tablet 650 mg (650 mg Oral Given 03/07/17 2328)  ibuprofen (ADVIL,MOTRIN) tablet 800 mg (800 mg Oral Given 03/08/17 0534)       Final Clinical Impressions(s) / ED Diagnoses    Flu like illness outside the window for tamiflu.  Well appearing.  Alternate tylenol and ibuprofen for pain relief and follow up with your PMD for recheck.  Well appearing.  No indication for labs imaging or  LP at this time.  No signs of sepsis nor intracranial infection.     Return for worsening pain, vomiting blood inability to pass urine,  fevers > 100.4 unrelieved by medication, shortness of breath, intractable vomiting, or diarrhea, abdominal pain, Inability to tolerate liquids or food, cough, altered mental status or any concerns. No signs of systemic illness or infection. The patient is nontoxic-appearing on exam and vital signs are within normal limits.    I have reviewed the triage vital signs and the nursing notes. Pertinent labs &imaging results that were available during my care of the patient were reviewed by me and considered in my medical decision making (see chart for details).  After history, exam, and medical workup I feel the patient has been appropriately medically screened and is safe for discharge home. Pertinent diagnoses were  discussed with the patient. Patient was given return precautions.   [name], April, MD 03/08/17 1610

## 2017-05-29 DIAGNOSIS — F17210 Nicotine dependence, cigarettes, uncomplicated: Secondary | ICD-10-CM | POA: Diagnosis not present

## 2017-05-29 DIAGNOSIS — M7662 Achilles tendinitis, left leg: Secondary | ICD-10-CM | POA: Diagnosis not present

## 2017-05-29 DIAGNOSIS — R0602 Shortness of breath: Secondary | ICD-10-CM | POA: Diagnosis present

## 2017-05-29 DIAGNOSIS — J9801 Acute bronchospasm: Secondary | ICD-10-CM | POA: Diagnosis not present

## 2017-05-29 NOTE — ED Notes (Signed)
Pt called to be triaged with no response.  RN notifed

## 2017-05-30 ENCOUNTER — Emergency Department (HOSPITAL_COMMUNITY): Payer: Medicare Other

## 2017-05-30 ENCOUNTER — Emergency Department (HOSPITAL_COMMUNITY)
Admission: EM | Admit: 2017-05-30 | Discharge: 2017-05-30 | Disposition: A | Discharge status: Home / Self Care | Payer: Medicare Other | Attending: Emergency Medicine | Admitting: Emergency Medicine

## 2017-05-30 ENCOUNTER — Encounter (HOSPITAL_COMMUNITY): Payer: Self-pay

## 2017-05-30 DIAGNOSIS — J9801 Acute bronchospasm: Secondary | ICD-10-CM

## 2017-05-30 DIAGNOSIS — M7662 Achilles tendinitis, left leg: Secondary | ICD-10-CM

## 2017-05-30 IMAGING — CR DG CHEST 2V
2 series · 2 of 2 positions shown · non-contrast
Comparison: [DATE]

CLINICAL DATA: Shortness of breath for 1 day.

EXAM:
CHEST - 2 VIEW

[w chest pa]
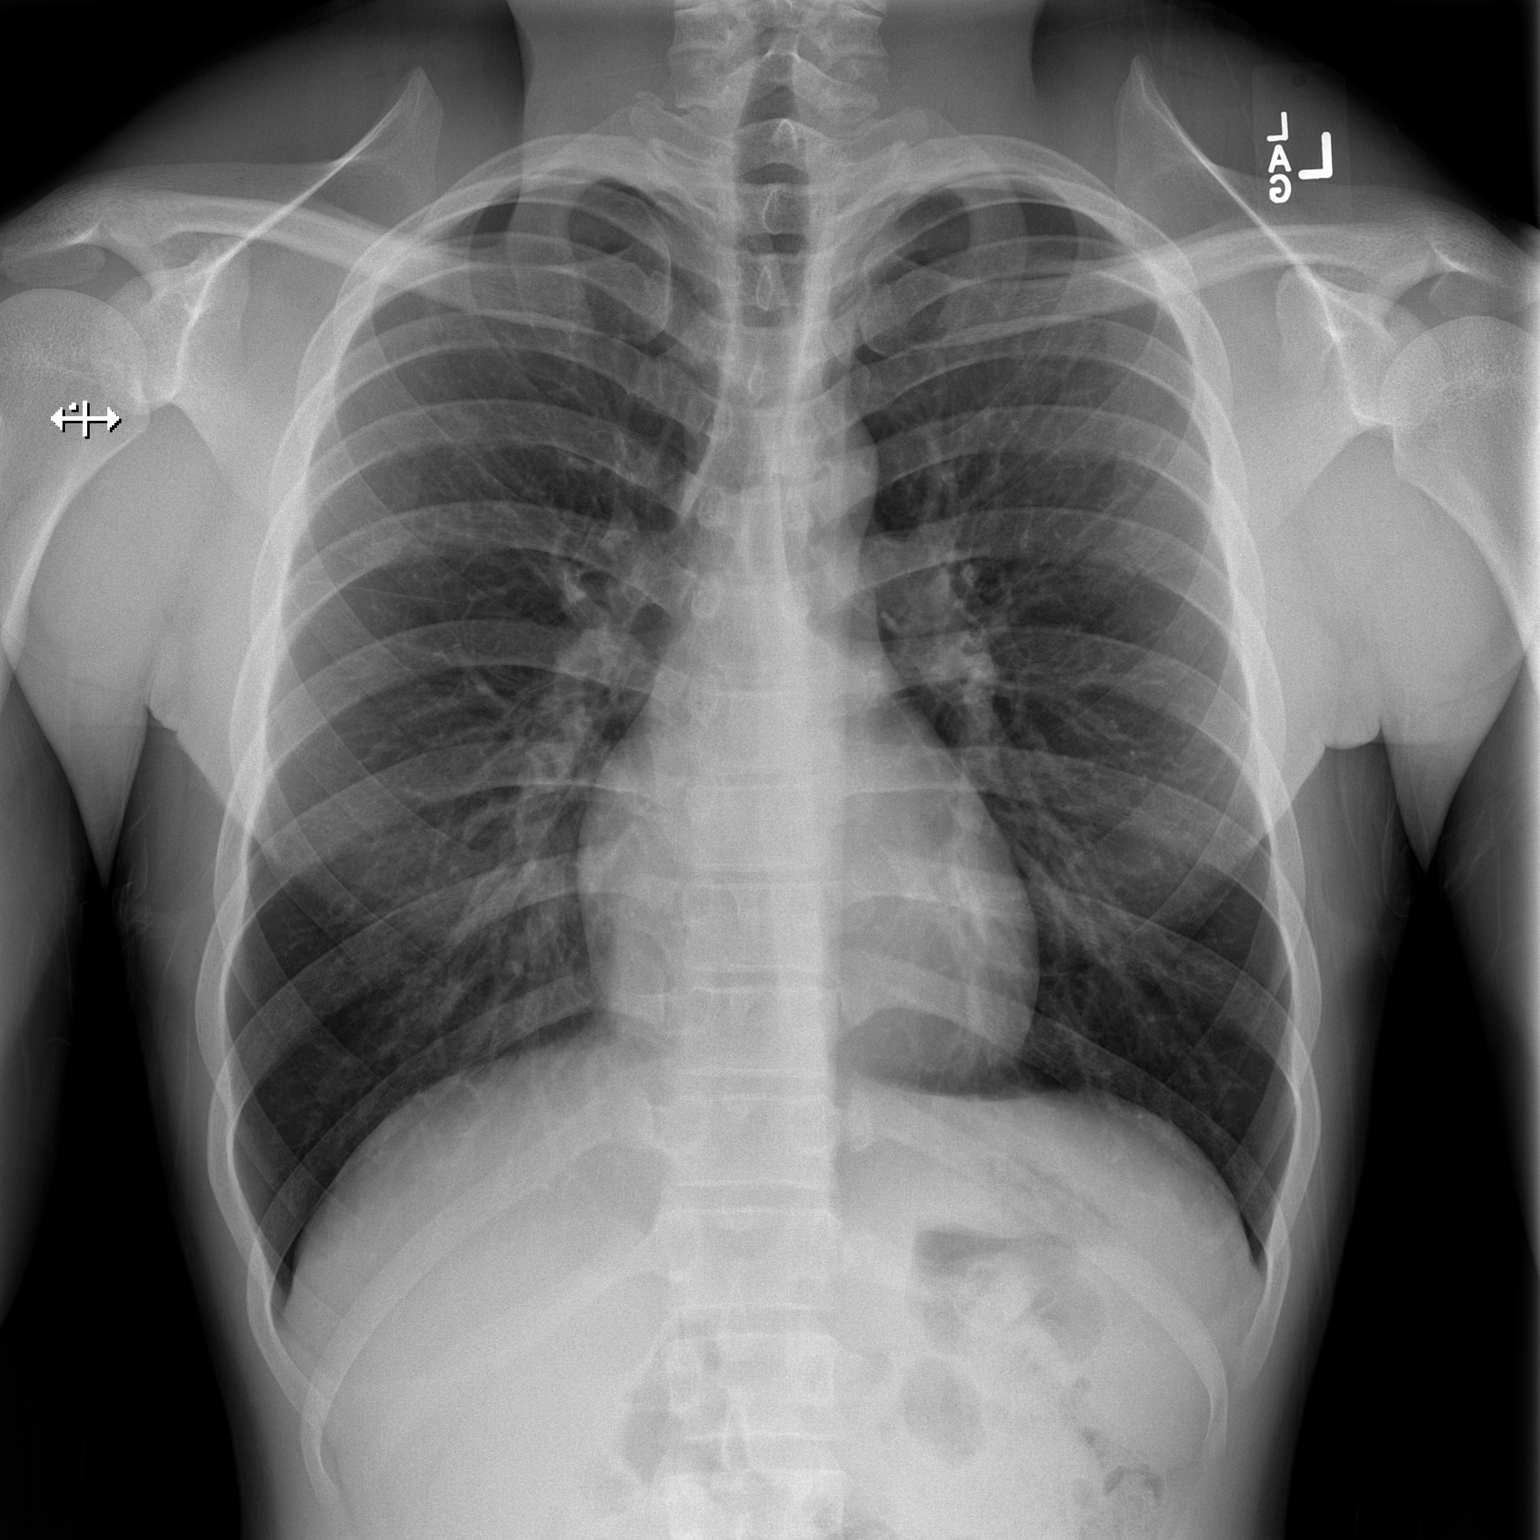

[w chest lat]
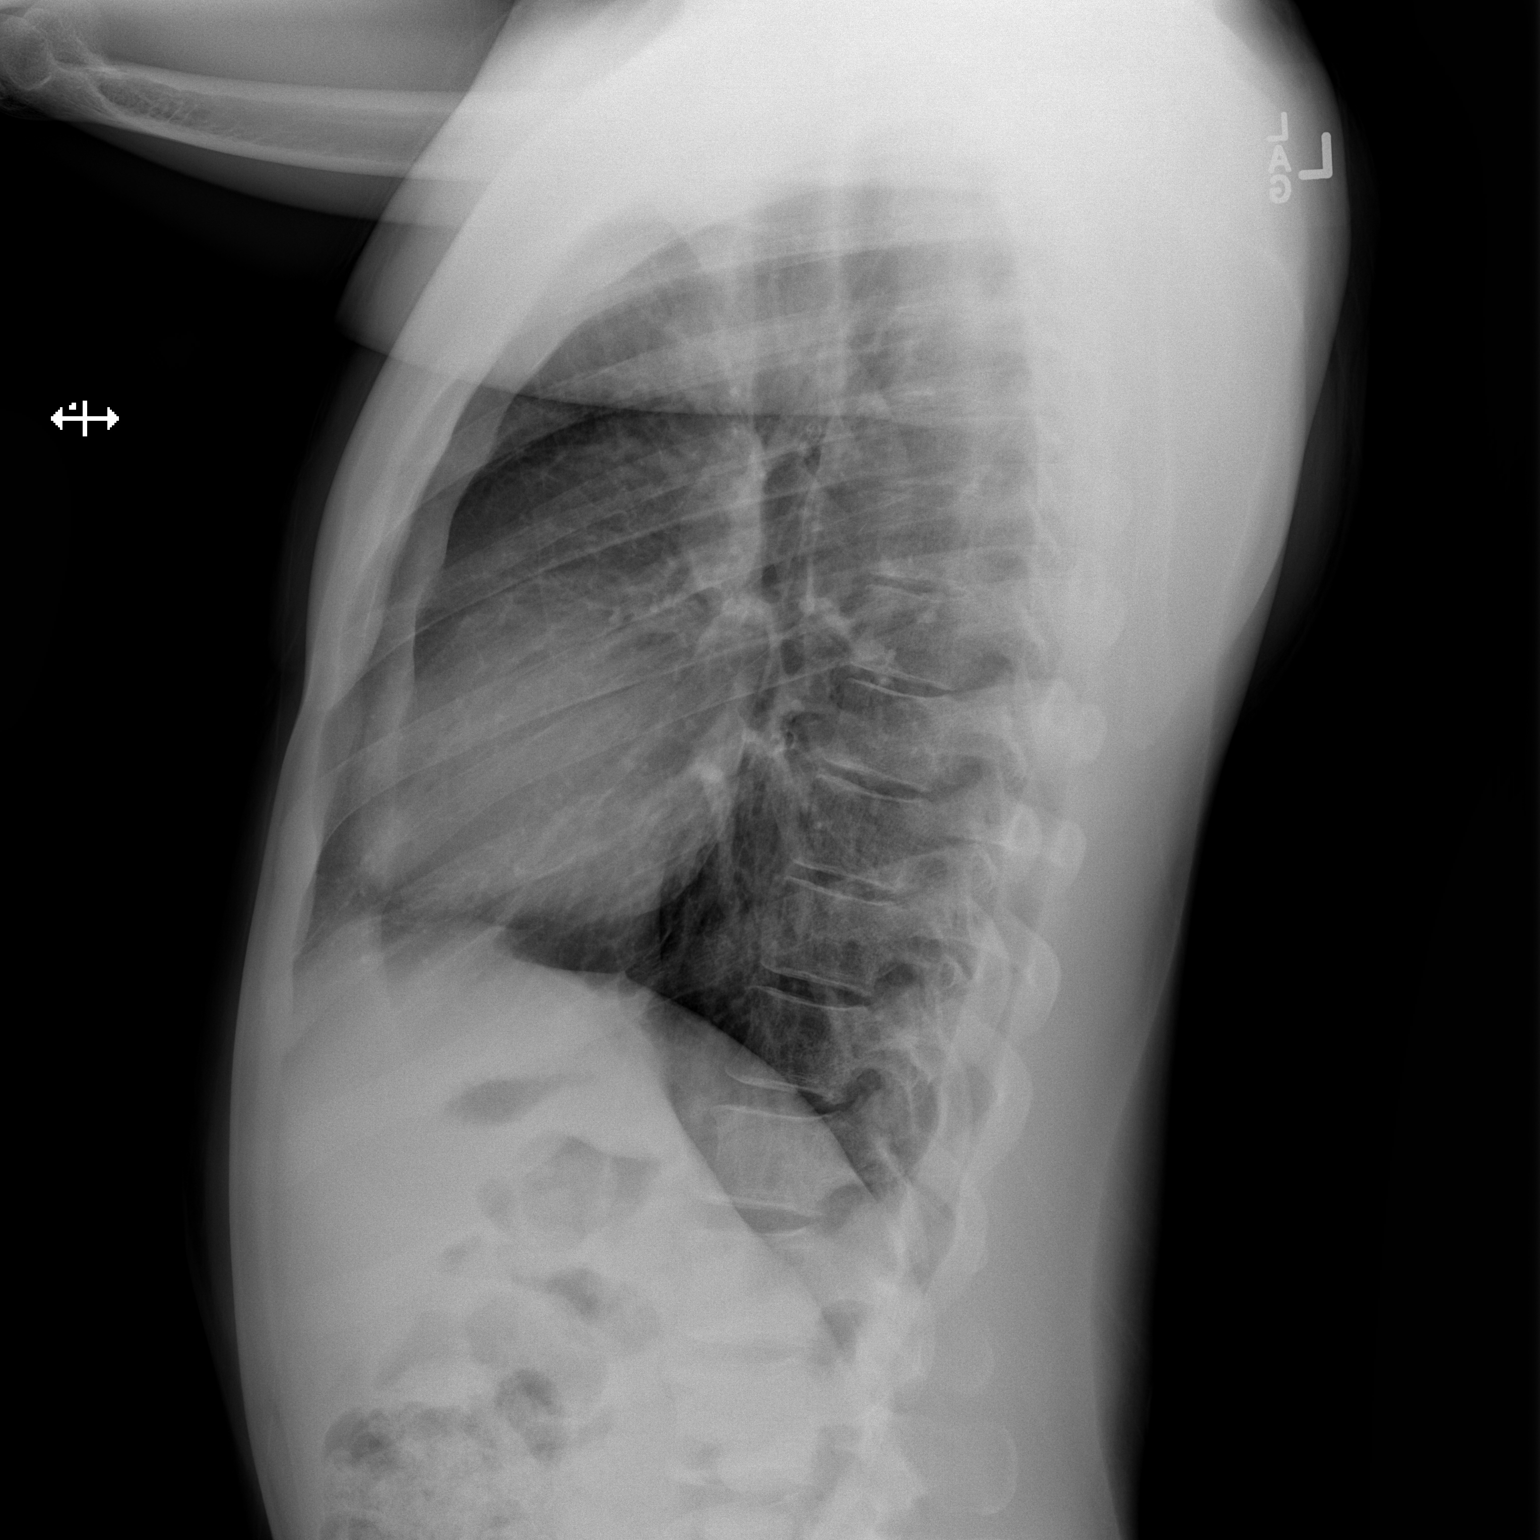

[2 of 2 positions shown; findings below may reference images not displayed]

FINDINGS: The cardiomediastinal contours are normal. The lungs are clear.
Pulmonary vasculature is normal. No consolidation, pleural effusion,
or pneumothorax. No acute osseous abnormalities are seen.
IMPRESSION: Unremarkable radiographs of the chest.

## 2017-05-30 MED ORDER — IBUPROFEN 800 MG PO TABS
800.0000 mg | ORAL_TABLET | Freq: Once | ORAL | Status: AC
Start: 2017-05-30 — End: 2017-05-30
  Administered 2017-05-30: 800 mg via ORAL
  Filled 2017-05-30: qty 1

## 2017-05-30 MED ORDER — ALBUTEROL SULFATE (2.5 MG/3ML) 0.083% IN NEBU
5.0000 mg | INHALATION_SOLUTION | Freq: Once | RESPIRATORY_TRACT | Status: AC
  Administered 2017-05-30: 5 mg via RESPIRATORY_TRACT
  Filled 2017-05-30: qty 6

## 2017-05-30 MED ORDER — OXYMETAZOLINE HCL 0.05 % NA SOLN
2.0000 | Freq: Two times a day (BID) | NASAL | Status: DC | PRN
  Administered 2017-05-30 (×2): 2 via NASAL
  Filled 2017-05-30: qty 15

## 2017-05-30 MED ORDER — ALBUTEROL SULFATE HFA 108 (90 BASE) MCG/ACT IN AERS
2.0000 | INHALATION_SPRAY | RESPIRATORY_TRACT | Status: DC | PRN
  Administered 2017-05-30: 2 via RESPIRATORY_TRACT
  Filled 2017-05-30: qty 6.7

## 2017-05-30 MED ORDER — NAPROXEN 375 MG PO TABS: ORAL_TABLET | ORAL | 0 refills | Status: DC

## 2017-05-30 NOTE — ED Triage Notes (Signed)
Pt complains of not being able to take a deep breath and it hurts in his lower back  Pt also states that he's had a dry cough Unrelated he complains of his left ankle hurting, no injury noted

## 2017-05-30 NOTE — ED Provider Notes (Signed)
[facility]-EMERGENCY DEPT Provider Note: Philip DellJ. Lane Molpus, MD, FACEP  CSN: [id number] MRN: [id number] ARRIVAL: 05/29/17 at 2118 ROOM: WA16/WA16   CHIEF COMPLAINT  Shortness of Breath   HISTORY OF PRESENT ILLNESS  05/30/17 3:34 AM Philip Ramos is a 22 y.o. male with a 3-day history of shortness of breath and wheezing.  Symptoms have been moderate.  He has been coughing as well.  He was noted to be wheezing on arrival and was given an albuterol neb treatment with partial improvement.  He states he still has difficulty taking a deep breath although he is no longer wheezing.  He is also having nasal congestion.  He has been having moderate pain in his left Achilles tendon for the past 2 days, worse with ambulation.  He denies injury to that tendon.   History reviewed. No pertinent past medical history.  History reviewed. No pertinent surgical history.  Family History  Problem Relation Age of Onset  . Hypertension Other   . Diabetes Other   . Cancer Other   . Cancer Mother   . Diabetes Mother   . Cancer Father   . Diabetes Father     Social History   Tobacco Use  . Smoking status: Current Every Day Smoker    Packs/day: 1.00    Types: Cigarettes  . Smokeless tobacco: Never Used  . Tobacco comment: smokes black and milds  Substance Use Topics  . Alcohol use: Yes    Alcohol/week: 0.6 oz    Types: 1 Cans of beer per week    Comment: occ  . Drug use: No    Prior to Admission medications   Medication Sig Start Date End Date Taking? Authorizing Provider  diphenhydrAMINE (SOMINEX) 25 MG tablet Take 25 mg by mouth 2 (two) times daily as needed for itching, allergies or sleep.   Yes [provider]  acetaminophen-codeine (TYLENOL #3) 300-30 MG per tablet Take 1 tablet by mouth 2 (two) times daily as needed for moderate pain. Patient not taking: Reported on 11/15/2014 11/03/13   [name], [name], MD  benzonatate (TESSALON) 100 MG capsule Take 1 capsule (100 mg total) by mouth 3  (three) times daily as needed for cough. Patient not taking: Reported on 03/08/2017 06/07/15   [name], [name], PA-C  cetirizine-pseudoephedrine (ZYRTEC-D) 5-120 MG tablet Take 1 tablet by mouth 2 (two) times daily. Patient not taking: Reported on 03/08/2017 06/07/15   [name], [name], PA-C  chlorpheniramine-HYDROcodone [facility](TUSSIONEX PENNKINETIC ER) 10-8 MG/5ML LQCR Take 5 mLs by mouth 2 (two) times daily. Patient not taking: Reported on 11/15/2014 01/14/14   [name], [name], PA-C  cyclobenzaprine (FLEXERIL) 10 MG tablet Take 1 tablet (10 mg total) by mouth 2 (two) times daily as needed for muscle spasms. Patient not taking: Reported on 03/08/2017 07/19/15   [name], [name], PA  diphenhydrAMINE (BENADRYL) 25 MG tablet Take 1 tablet (25 mg total) by mouth every 6 (six) hours as needed for itching (Rash). Patient not taking: Reported on 03/08/2015 11/15/14   [name], [name], PA-C  ibuprofen (ADVIL,MOTRIN) 800 MG tablet Take 1 tablet (800 mg total) by mouth 3 (three) times daily. Patient not taking: Reported on 05/30/2017 03/08/17   [name], April, MD  meloxicam (MOBIC) 15 MG tablet Take 1 tablet (15 mg total) by mouth daily. Patient not taking: Reported on 03/08/2017 07/19/15   [name], [name], PA  naproxen (NAPROSYN) 500 MG tablet Take 1 tablet (500 mg total) by mouth 2 (two) times daily. Patient not taking: Reported on 03/08/2017 06/07/15  Antony Madura, PA-C  terbinafine (LAMISIL AT) 1 % cream Apply 1 application topically 2 (two) times daily. Patient not taking: Reported on 03/08/2017 07/19/15   [name], PA  tobramycin (TOBREX) 0.3 % ophthalmic solution Place 2 drops into the left eye every 4 (four) hours. Patient not taking: Reported on 03/08/2017 08/24/15   [name], PA-C    Allergies Amoxicillin; Peanut-containing drug products; Penicillins; and Shellfish allergy   REVIEW OF SYSTEMS  Negative except as noted here or in the History of Present Illness.   PHYSICAL EXAMINATION  Initial Vital  Signs Blood pressure (!) 141/75, pulse 71, temperature 98.4 F (36.9 C), temperature source Oral, resp. rate 20, SpO2 93 %.  Examination General: Well-developed, well-nourished male in no acute distress; appearance consistent with age of record HENT: normocephalic; atraumatic; nasal congestion Eyes: pupils equal, round and reactive to light; extraocular muscles intact Neck: supple Heart: regular rate and rhythm Lungs: Decreased air movement bilaterally without frank wheezing Abdomen: soft; nondistended; nontender; bowel sounds present Extremities: No deformity; full range of motion; pulses normal; tenderness of left Achilles tendon without erythema or swelling, negative Thompson's test Neurologic: Awake, alert and oriented; motor function intact in all extremities and symmetric; no facial droop Skin: Warm and dry Psychiatric: Normal mood and affect   RESULTS  Summary of this visit's results, reviewed by myself:   EKG Interpretation  Date/Time:    Ventricular Rate:    PR Interval:    QRS Duration:   QT Interval:    QTC Calculation:   R Axis:     Text Interpretation:        Laboratory Studies: No results found for this or any previous visit (from the past 24 hour(s)). Imaging Studies: Dg Chest 2 View  Result Date: 05/30/2017 CLINICAL DATA:  Shortness of breath for 1 day. EXAM: CHEST - 2 VIEW COMPARISON:  06/07/2015 FINDINGS: The cardiomediastinal contours are normal. The lungs are clear. Pulmonary vasculature is normal. No consolidation, pleural effusion, or pneumothorax. No acute osseous abnormalities are seen. IMPRESSION: Unremarkable radiographs of the chest. Electronically Signed   By: Rubye Oaks M.D.   On: 05/30/2017 01:43    ED COURSE  Nursing notes and initial vitals signs, including pulse oximetry, reviewed.  Vitals:   05/30/17 0013  BP: (!) 141/75  Pulse: 71  Resp: 20  Temp: 98.4 F (36.9 C)  TempSrc: Oral  SpO2: 93%   4:37 AM Movement improved  after second neb treatment.  Patient's heel placed in ASO.  PROCEDURES    ED DIAGNOSES     ICD-10-CM   1. Acute bronchospasm J98.01   2. Achilles tendinitis of left lower extremity M76.62        Ramos, Jonny Ruiz, MD 05/30/17 [phone]

## 2017-07-18 DIAGNOSIS — R42 Dizziness and giddiness: Secondary | ICD-10-CM | POA: Diagnosis not present

## 2017-07-18 DIAGNOSIS — R069 Unspecified abnormalities of breathing: Secondary | ICD-10-CM | POA: Diagnosis not present

## 2017-07-18 DIAGNOSIS — R062 Wheezing: Secondary | ICD-10-CM | POA: Diagnosis not present

## 2017-07-19 ENCOUNTER — Emergency Department (HOSPITAL_COMMUNITY)
Admission: EM | Admit: 2017-07-19 | Discharge: 2017-07-19 | Disposition: A | Discharge status: Home / Self Care | Payer: Medicare Other | Attending: Emergency Medicine | Admitting: Emergency Medicine

## 2017-07-19 ENCOUNTER — Emergency Department (HOSPITAL_COMMUNITY): Payer: Medicare Other

## 2017-07-19 ENCOUNTER — Encounter (HOSPITAL_COMMUNITY): Payer: Self-pay

## 2017-07-19 DIAGNOSIS — R0602 Shortness of breath: Secondary | ICD-10-CM | POA: Insufficient documentation

## 2017-07-19 DIAGNOSIS — R0789 Other chest pain: Secondary | ICD-10-CM | POA: Insufficient documentation

## 2017-07-19 DIAGNOSIS — Z5321 Procedure and treatment not carried out due to patient leaving prior to being seen by health care provider: Secondary | ICD-10-CM | POA: Diagnosis not present

## 2017-07-19 DIAGNOSIS — R05 Cough: Secondary | ICD-10-CM | POA: Insufficient documentation

## 2017-07-19 LAB — CBC
HCT: 46.3 % (ref 39.0–52.0)
Hemoglobin: 15.5 g/dL (ref 13.0–17.0)
MCH: 28.2 pg (ref 26.0–34.0)
MCHC: 33.5 g/dL (ref 30.0–36.0)
MCV: 84.2 fL (ref 78.0–100.0)
Platelets: 150 10*3/uL (ref 150–400)
RBC: 5.5 MIL/uL (ref 4.22–5.81)
RDW: 12.9 % (ref 11.5–15.5)
WBC: 5.7 10*3/uL (ref 4.0–10.5)

## 2017-07-19 LAB — BASIC METABOLIC PANEL
Anion gap: 9 (ref 5–15)
BUN: 21 mg/dL — ABNORMAL HIGH (ref 6–20)
CO2: 30 mmol/L (ref 22–32)
Calcium: 9.4 mg/dL (ref 8.9–10.3)
Chloride: 104 mmol/L (ref 101–111)
Creatinine, Ser: 1.36 mg/dL — ABNORMAL HIGH (ref 0.61–1.24)
GFR calc Af Amer: >60 mL/min (ref >60)
GFR calc non Af Amer: >60 mL/min (ref >60)
Glucose, Bld: 107 mg/dL — ABNORMAL HIGH (ref 65–99)
Potassium: 3.8 mmol/L (ref 3.5–5.1)
Sodium: 143 mmol/L (ref 135–145)

## 2017-07-19 LAB — I-STAT TROPONIN, ED: Troponin i, poc: 0 ng/mL (ref 0.00–0.08)

## 2017-07-19 IMAGING — CR DG CHEST 2V
2 series · 2 of 2 positions shown · non-contrast
Comparison: Chest radiograph performed [DATE]

CLINICAL DATA: Acute onset of shortness of breath and wheezing.
Left arm pain. Initial encounter.

EXAM:
CHEST - 2 VIEW

[chest pa]
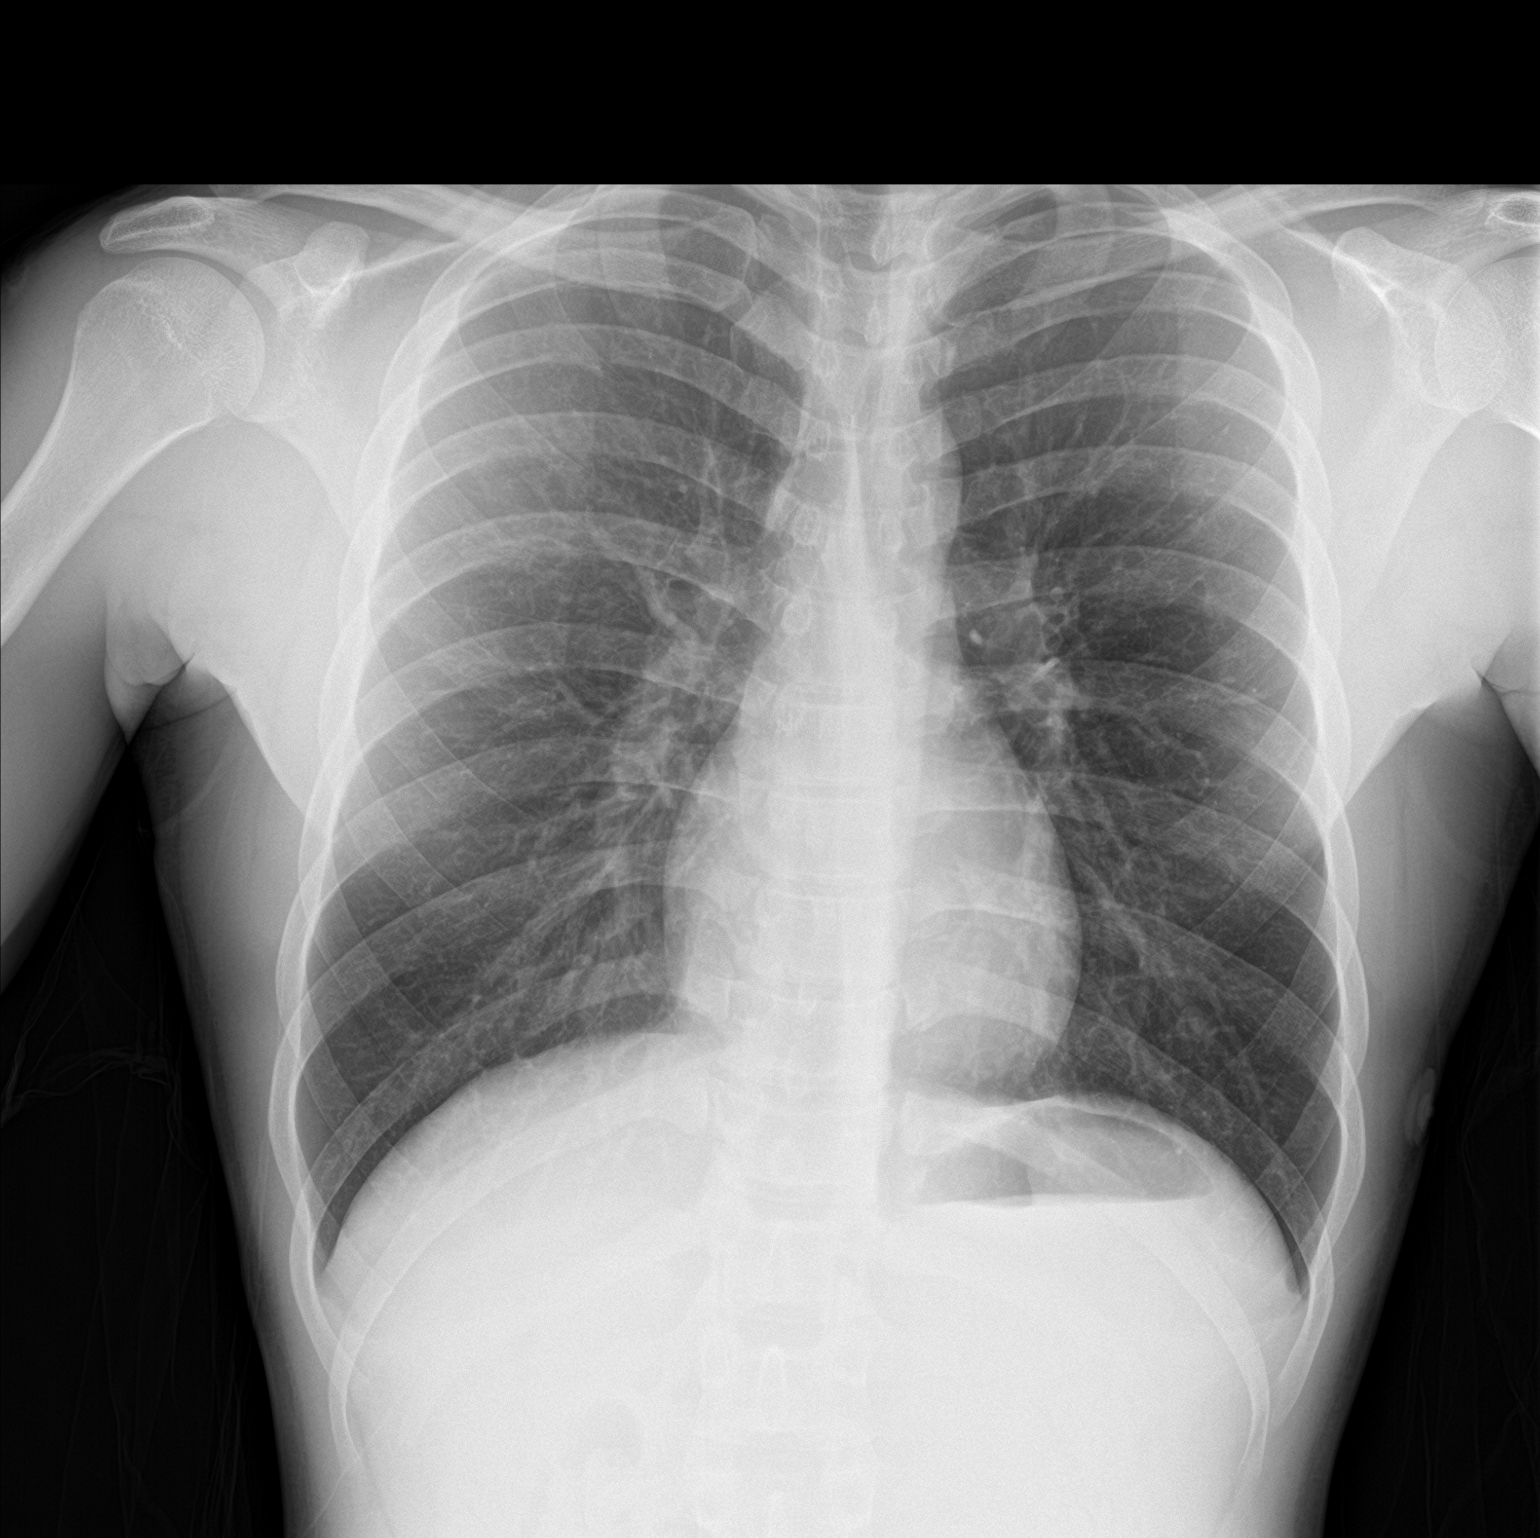

[chest lat]
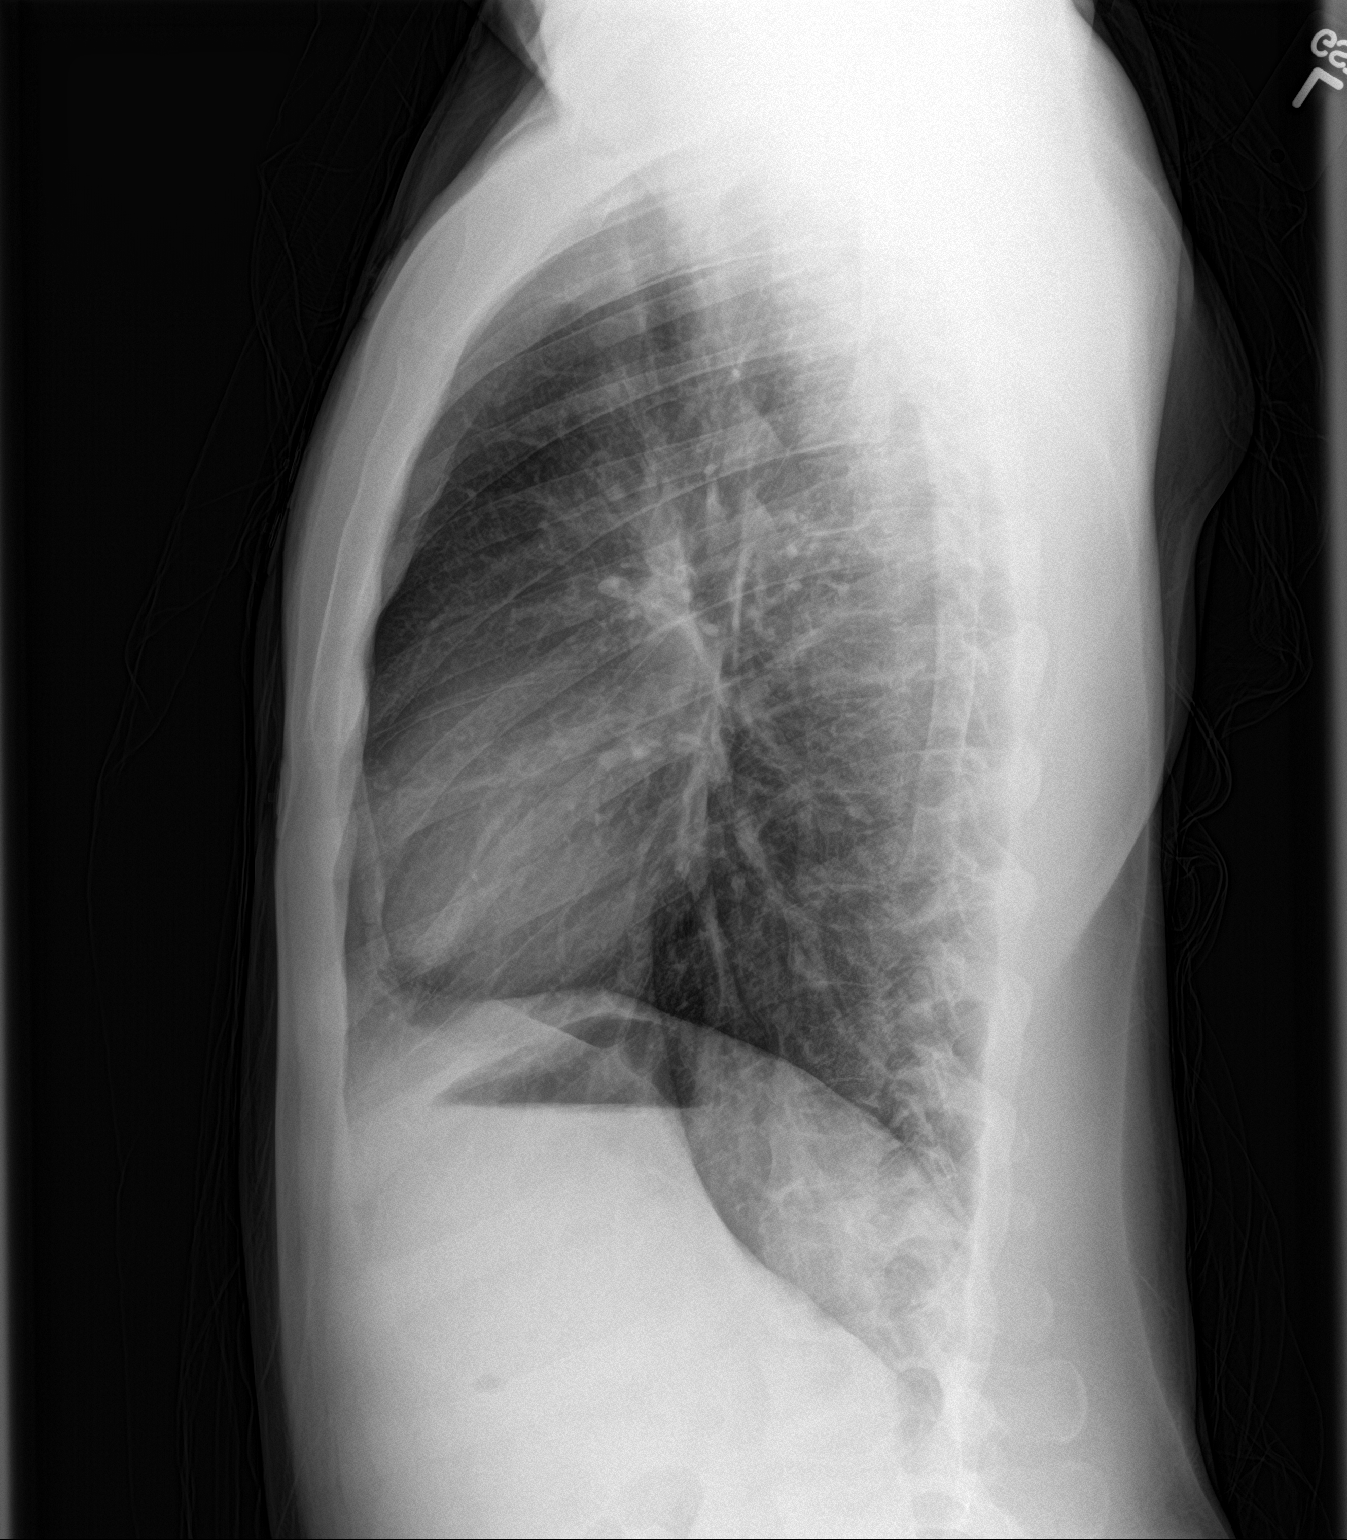

[2 of 2 positions shown; findings below may reference images not displayed]

FINDINGS: The lungs are well-aerated and clear. There is no evidence of focal
opacification, pleural effusion or pneumothorax.

The heart is normal in size; the mediastinal contour is within
normal limits. No acute osseous abnormalities are seen.
IMPRESSION: No acute cardiopulmonary process seen.

## 2017-07-19 NOTE — ED Triage Notes (Addendum)
Patient BIB [facility] for difficulty breathing. Patient states he started having difficulty breathing yesterday with a productive cough. Patient also c/o of chest pain/tightness 8/10 "it's like an elephant is sitting on my chest and my LEFT arm hurts". Denies nausea, vomiting, fever, abdominal pain, or dysuria. EMS gave 15 albuterol and 1 mg Atrovent.

## 2017-07-19 NOTE — ED Notes (Signed)
Called x 3 with no answer.

## 2017-07-19 NOTE — ED Notes (Signed)
Follow up call made  No accepting calls  1135 07/19/17  s coble rn

## 2017-08-17 ENCOUNTER — Emergency Department (HOSPITAL_COMMUNITY)
Admission: EM | Admit: 2017-08-17 | Discharge: 2017-08-17 | Disposition: A | Discharge status: Home / Self Care | Payer: Medicare Other | Attending: Emergency Medicine | Admitting: Emergency Medicine

## 2017-08-17 ENCOUNTER — Other Ambulatory Visit: Payer: Self-pay

## 2017-08-17 ENCOUNTER — Encounter (HOSPITAL_COMMUNITY): Payer: Self-pay

## 2017-08-17 DIAGNOSIS — J45909 Unspecified asthma, uncomplicated: Secondary | ICD-10-CM | POA: Diagnosis not present

## 2017-08-17 DIAGNOSIS — Z91010 Allergy to peanuts: Secondary | ICD-10-CM | POA: Diagnosis not present

## 2017-08-17 DIAGNOSIS — F17210 Nicotine dependence, cigarettes, uncomplicated: Secondary | ICD-10-CM | POA: Diagnosis not present

## 2017-08-17 DIAGNOSIS — Z79899 Other long term (current) drug therapy: Secondary | ICD-10-CM | POA: Diagnosis not present

## 2017-08-17 DIAGNOSIS — T63441A Toxic effect of venom of bees, accidental (unintentional), initial encounter: Secondary | ICD-10-CM | POA: Diagnosis not present

## 2017-08-17 HISTORY — DX: Unspecified asthma, uncomplicated: J45.909

## 2017-08-17 MED ORDER — PREDNISONE 20 MG PO TABS
60.0000 mg | ORAL_TABLET | Freq: Once | ORAL | Status: AC
  Administered 2017-08-17: 60 mg via ORAL
  Filled 2017-08-17: qty 3

## 2017-08-17 MED ORDER — LORATADINE 10 MG PO TABS: 10.0000 mg | ORAL_TABLET | Freq: Every day | ORAL | 0 refills | Status: DC

## 2017-08-17 MED ORDER — LORATADINE 10 MG PO TABS
10.0000 mg | ORAL_TABLET | Freq: Once | ORAL | Status: AC
  Administered 2017-08-17: 10 mg via ORAL
  Filled 2017-08-17: qty 1

## 2017-08-17 MED ORDER — FLUORESCEIN SODIUM 1 MG OP STRP
1.0000 | ORAL_STRIP | Freq: Once | OPHTHALMIC | Status: DC
  Filled 2017-08-17: qty 1

## 2017-08-17 MED ORDER — PREDNISONE 10 MG PO TABS: 40.0000 mg | ORAL_TABLET | Freq: Every day | ORAL | 0 refills | Status: DC

## 2017-08-17 MED ORDER — FAMOTIDINE 20 MG PO TABS
20.0000 mg | ORAL_TABLET | Freq: Once | ORAL | Status: AC
  Administered 2017-08-17: 20 mg via ORAL
  Filled 2017-08-17: qty 1

## 2017-08-17 MED ORDER — RANITIDINE HCL 150 MG PO TABS: 150.0000 mg | ORAL_TABLET | Freq: Two times a day (BID) | ORAL | 0 refills | Status: DC

## 2017-08-17 MED ORDER — TETRACAINE HCL 0.5 % OP SOLN
2.0000 [drp] | Freq: Once | OPHTHALMIC | Status: DC
  Filled 2017-08-17: qty 4
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
## 2017-08-17 NOTE — ED Provider Notes (Signed)
[facility]-EMERGENCY DEPT Provider Note   CSN: [id number] Arrival date & time: 08/17/17  1905     History   Chief Complaint Chief Complaint  Patient presents with  . Facial Swelling    RIGHT    HPI Philip Ramos is a 22 y.o. male who presents to the ED with swelling and itching of the right eye s/p yellow jacket stings to both eyebrows and right side of face yesterday. Patient denies visual changes or pain inside eyes. Patient taking benadryl for itching and swelling. Patient denies shortness of breath or throat swelling. HPI  Past Medical History:  Diagnosis Date  . Asthma     There are no active problems to display for this patient.   History reviewed. No pertinent surgical history.      Home Medications    Prior to Admission medications   Medication Sig Start Date End Date Taking? Authorizing Provider  diphenhydrAMINE (SOMINEX) 25 MG tablet Take 25 mg by mouth 2 (two) times daily as needed for itching, allergies or sleep.    [provider]  loratadine (CLARITIN) 10 MG tablet Take 1 tablet (10 mg total) by mouth daily. 08/17/17   Janne Napoleon, NP  naproxen (NAPROSYN) 375 MG tablet Take 1 tablet twice daily as needed for heel pain. 05/30/17   Molpus, John, MD  predniSONE (DELTASONE) 10 MG tablet Take 4 tablets (40 mg total) by mouth daily. 08/17/17   Janne Napoleon, NP  ranitidine (ZANTAC) 150 MG tablet Take 1 tablet (150 mg total) by mouth 2 (two) times daily. 08/17/17   Janne Napoleon, NP    Family History Family History  Problem Relation Age of Onset  . Hypertension Other   . Diabetes Other   . Cancer Other   . Cancer Mother   . Diabetes Mother   . Cancer Father   . Diabetes Father     Social History Social History   Tobacco Use  . Smoking status: Current Every Day Smoker    Packs/day: 1.00    Types: Cigarettes  . Smokeless tobacco: Never Used  . Tobacco comment: smokes black and milds  Substance Use Topics  . Alcohol  use: Yes    Alcohol/week: 0.6 oz    Types: 1 Cans of beer per week    Comment: occ  . Drug use: No     Allergies   Amoxicillin; Peanut-containing drug products; Penicillins; and Shellfish allergy   Review of Systems Review of Systems  Skin: Positive for wound.       Insect bite, swelling right side of face  All other systems reviewed and are negative.    Physical Exam Updated Vital Signs BP 111/72 (BP Location: Left Arm)   Pulse 76   Temp 97.7 F (36.5 C) (Oral)   Resp 15   Ht 5\' 10"  (1.778 m)   Wt 72.6 kg (160 lb)   SpO2 99%   BMI 22.96 kg/m   Physical Exam  Constitutional: He is oriented to person, place, and time. He appears well-developed and well-nourished. No distress.  HENT:  Mouth/Throat: Oropharynx is clear and moist.  Swelling and erythema noted to right side of face around the right eye c/w allergic local reaction to bee sting. No red streaking or signs of infection.  Eyes: Pupils are equal, round, and reactive to light. Conjunctivae and EOM are normal.  Swelling of the right orbit due to local reaction to insect sting.   Neck: Normal range of  motion. Neck supple.  Cardiovascular: Normal rate and regular rhythm.  Pulmonary/Chest: Effort normal and breath sounds normal.  Musculoskeletal: Normal range of motion.  Neurological: He is alert and oriented to person, place, and time. No cranial nerve deficit.  Skin: Skin is warm and dry.  Psychiatric: He has a normal mood and affect.  Nursing note and vitals reviewed.    ED Treatments / Results  Labs (all labs ordered are listed, but only abnormal results are displayed) Labs Reviewed - No data to display  EKG None  Radiology No results found.  Procedures Procedures (including critical care time)  Medications Ordered in ED Medications  tetracaine (PONTOCAINE) 0.5 % ophthalmic solution 2 drop (has no administration in time range)  fluorescein ophthalmic strip 1 strip (has no administration in time  range)  loratadine (CLARITIN) tablet 10 mg (has no administration in time range)  predniSONE (DELTASONE) tablet 60 mg (has no administration in time range)  famotidine (PEPCID) tablet 20 mg (has no administration in time range)     Initial Impression / Assessment and Plan / ED Course  I have reviewed the triage vital signs and the nursing notes. 22 y.o. male with local reaction to bee sting stable for d/c without difficulty swallowing or breathing. Will treat for allergic reaction with Claritin, steroid burst, Zantac and patient will continue Benadryl as needed. Return precautions discussed.  Final Clinical Impressions(s) / ED Diagnoses   Final diagnoses:  Allergic reaction to bee sting    ED Discharge Orders        Ordered    predniSONE (DELTASONE) 10 MG tablet  Daily     08/17/17 2053    loratadine (CLARITIN) 10 MG tablet  Daily     08/17/17 2053    ranitidine (ZANTAC) 150 MG tablet  2 times daily     08/17/17 2055       Kerrie Buffaloeese, Hope LyonsM, [place]NP 08/17/17 2101    Benjiman CorePickering, Nathan, MD 08/17/17 2348

## 2017-08-17 NOTE — Discharge Instructions (Addendum)
Continue your benadryl. Return for worsening symptoms.

## 2017-08-24 ENCOUNTER — Other Ambulatory Visit: Payer: Self-pay

## 2017-08-24 ENCOUNTER — Encounter (HOSPITAL_COMMUNITY): Payer: Self-pay

## 2017-08-24 ENCOUNTER — Emergency Department (HOSPITAL_COMMUNITY)
Admission: EM | Admit: 2017-08-24 | Discharge: 2017-08-25 | Disposition: A | Discharge status: Home / Self Care | Payer: Medicare Other | Attending: Emergency Medicine | Admitting: Emergency Medicine

## 2017-08-24 DIAGNOSIS — J45909 Unspecified asthma, uncomplicated: Secondary | ICD-10-CM | POA: Diagnosis not present

## 2017-08-24 DIAGNOSIS — F17210 Nicotine dependence, cigarettes, uncomplicated: Secondary | ICD-10-CM | POA: Insufficient documentation

## 2017-08-24 DIAGNOSIS — Z79899 Other long term (current) drug therapy: Secondary | ICD-10-CM | POA: Diagnosis not present

## 2017-08-24 DIAGNOSIS — L03211 Cellulitis of face: Secondary | ICD-10-CM | POA: Diagnosis not present

## 2017-08-24 DIAGNOSIS — Z91010 Allergy to peanuts: Secondary | ICD-10-CM | POA: Diagnosis not present

## 2017-08-24 DIAGNOSIS — J3489 Other specified disorders of nose and nasal sinuses: Secondary | ICD-10-CM | POA: Diagnosis present

## 2017-08-24 DIAGNOSIS — J340 Abscess, furuncle and carbuncle of nose: Secondary | ICD-10-CM | POA: Insufficient documentation

## 2017-08-24 MED ORDER — MUPIROCIN CALCIUM 2 % EX CREA
TOPICAL_CREAM | Freq: Once | CUTANEOUS | Status: AC
  Administered 2017-08-24: 1 via TOPICAL
  Filled 2017-08-24: qty 15

## 2017-08-24 MED ORDER — SULFAMETHOXAZOLE-TRIMETHOPRIM 800-160 MG PO TABS
1.0000 | ORAL_TABLET | Freq: Once | ORAL | Status: AC
  Administered 2017-08-24: 1 via ORAL
  Filled 2017-08-24: qty 1

## 2017-08-24 MED ORDER — NAPROXEN 500 MG PO TABS
500.0000 mg | ORAL_TABLET | Freq: Once | ORAL | Status: AC
  Administered 2017-08-24: 500 mg via ORAL
  Filled 2017-08-24: qty 1

## 2017-08-24 NOTE — ED Notes (Signed)
1x call back to room, no answer

## 2017-08-24 NOTE — ED Triage Notes (Addendum)
Pt c/o a bump in nose that started 2 days ago. Attempted to use witch hazel and neosporin. Woke up today with localized upper lip swelling. Airway maintained. Denies known exposure to allergens. Used tylenol with no relief.

## 2017-08-24 NOTE — ED Provider Notes (Signed)
[facility]-EMERGENCY DEPT Provider Note   CSN: [id number] Arrival date & time: 08/24/17  2108     History   Chief Complaint Chief Complaint  Patient presents with  . Oral Swelling    HPI Philip Ramos is a 22 y.o. male.  HPI   22 year old male here with R nasal pain. Pt states pain began 2-3 days ago as mild swelling along her right outer nostril.  He then developed worsening redness and drainage. Over the last 24 hours, pt has had increasing swelling, pain, redness along her lip and up her nose. She's had some small yellow drainage. No dental pain. He reports associated aching, throbbing, right nasal pain.  Denies any fevers or chills.  No relieving factors.  Pain is worse with movement palpation.  No change in vision.  Past Medical History:  Diagnosis Date  . Asthma     There are no active problems to display for this patient.   History reviewed. No pertinent surgical history.      Home Medications    Prior to Admission medications   Medication Sig Start Date End Date Taking? Authorizing Provider  predniSONE (DELTASONE) 10 MG tablet Take 4 tablets (40 mg total) by mouth daily. 08/17/17  Yes Neese, Hope M, NP  ranitidine (ZANTAC) 150 MG tablet Take 1 tablet (150 mg total) by mouth 2 (two) times daily. 08/17/17  Yes Neese, Hope M, NP  clindamycin (CLEOCIN) 300 MG capsule Take 1 capsule (300 mg total) by mouth 3 (three) times daily for 10 days. 08/25/17 09/04/17  Shaune Pollack, MD  diphenhydrAMINE (SOMINEX) 25 MG tablet Take 25 mg by mouth 2 (two) times daily as needed for itching, allergies or sleep.    [provider]  ibuprofen (ADVIL,MOTRIN) 600 MG tablet Take 1 tablet (600 mg total) by mouth every 8 (eight) hours as needed for moderate pain. 08/25/17   Shaune Pollack, MD  loratadine (CLARITIN) 10 MG tablet Take 1 tablet (10 mg total) by mouth daily. Patient not taking: Reported on 08/24/2017 08/17/17   Janne Napoleon, NP  mupirocin cream  (BACTROBAN) 2 % Apply 1 application topically 2 (two) times daily. 08/25/17   Shaune Pollack, MD  naproxen (NAPROSYN) 375 MG tablet Take 1 tablet twice daily as needed for heel pain. Patient not taking: Reported on 08/24/2017 05/30/17   Molpus, Jonny Ruiz, MD  traMADol (ULTRAM) 50 MG tablet Take 1 tablet (50 mg total) by mouth every 6 (six) hours as needed for severe pain. 08/25/17   Shaune Pollack, MD    Family History Family History  Problem Relation Age of Onset  . Hypertension Other   . Diabetes Other   . Cancer Other   . Cancer Mother   . Diabetes Mother   . Cancer Father   . Diabetes Father     Social History Social History   Tobacco Use  . Smoking status: Current Every Day Smoker    Packs/day: 1.00    Types: Cigarettes  . Smokeless tobacco: Never Used  . Tobacco comment: smokes black and milds  Substance Use Topics  . Alcohol use: Yes    Alcohol/week: 0.6 oz    Types: 1 Cans of beer per week    Comment: occ  . Drug use: No     Allergies   Amoxicillin; Peanut-containing drug products; Penicillins; and Shellfish allergy   Review of Systems Review of Systems  Constitutional: Negative for chills, fatigue and fever.  HENT: Positive for facial swelling. Negative for  congestion and rhinorrhea.   Eyes: Negative for visual disturbance.  Respiratory: Negative for cough, shortness of breath and wheezing.   Cardiovascular: Negative for chest pain and leg swelling.  Gastrointestinal: Negative for abdominal pain, diarrhea, nausea and vomiting.  Genitourinary: Negative for dysuria and flank pain.  Musculoskeletal: Negative for neck pain and neck stiffness.  Skin: Negative for rash and wound.  Allergic/Immunologic: Negative for immunocompromised state.  Neurological: Negative for syncope, weakness and headaches.  All other systems reviewed and are negative.    Physical Exam Updated Vital Signs BP 132/67 (BP Location: Left Arm)   Pulse 84   Temp 98.2 F (36.8 C) (Oral)    Resp 20   Ht 5\' 10"  (1.778 m)   Wt 72.6 kg (160 lb)   SpO2 95%   BMI 22.96 kg/m   Physical Exam  Constitutional: He is oriented to person, place, and time. He appears well-developed and well-nourished. No distress.  HENT:  Head: Normocephalic and atraumatic.  Moderate erythema and induration over the right midline septum and external nares, involving the upper philtrum.  No fluctuance.  No involvement of the posterior nasopharynx.  No drainage.  Eyes: Conjunctivae are normal.  Neck: Neck supple.  Cardiovascular: Normal rate, regular rhythm and normal heart sounds. Exam reveals no friction rub.  No murmur heard. Pulmonary/Chest: Effort normal and breath sounds normal. No respiratory distress. He has no wheezes. He has no rales.  Abdominal: He exhibits no distension.  Musculoskeletal: He exhibits no edema.  Neurological: He is alert and oriented to person, place, and time. He exhibits normal muscle tone.  Skin: Skin is warm. Capillary refill takes less than 2 seconds.  Psychiatric: He has a normal mood and affect.  Nursing note and vitals reviewed.    ED Treatments / Results  Labs (all labs ordered are listed, but only abnormal results are displayed) Labs Reviewed - No data to display  EKG None  Radiology No results found.  Procedures Procedures (including critical care time)  EMERGENCY DEPARTMENT US SOFT TISSUE INTERPRETATION "Study: Limited Soft Tissue Ultrasound"  INDICATIONS: Pain and Soft tissue infection Multiple views of the body part were obtained in real-time with a multi-frequency linear probe  PERFORMED BY: Myself IMAGES ARCHIVED?: Yes SIDE:Right nostril BODY PART:Right nostril/face INTERPRETATION:  No abcess noted and Cellulitis present     Medications Ordered in ED Medications  naproxen (NAPROSYN) tablet 500 mg (500 mg Oral Given 08/24/17 2334)  sulfamethoxazole-trimethoprim (BACTRIM DS,SEPTRA DS) 800-160 MG per tablet 1 tablet (1 tablet Oral Given  08/24/17 2334)  mupirocin cream (BACTROBAN) 2 % (1 application Topical Given 08/24/17 2335)     Initial Impression / Assessment and Plan / ED Course  I have reviewed the triage vital signs and the nursing notes.  Pertinent labs & imaging results that were available during my care of the patient were reviewed by me and considered in my medical decision making (see chart for details).     22 year old male here with likely superficial folliculitis of right nostril, likely exacerbated by wearing a mask at work.  Ultrasound shows no evidence of abscess.  He has no evidence of concomitant facial cellulitis or deep infection.  No signs of cavernous sinus thrombosis.  Is neurologically intact.  Will place on [place] given his allergy to amoxicillin, discharged with outpatient follow-up and good return precautions.  Final Clinical Impressions(s) / ED Diagnoses   Final diagnoses:  Cellulitis of nasal tip    ED Discharge Orders  Ordered    clindamycin (CLEOCIN) 300 MG capsule  3 times daily     08/25/17 0030    traMADol (ULTRAM) 50 MG tablet  Every 6 hours PRN     08/25/17 0030    ibuprofen (ADVIL,MOTRIN) 600 MG tablet  Every 8 hours PRN     08/25/17 0030    mupirocin cream (BACTROBAN) 2 %  2 times daily     08/25/17 0030       Shaune Pollack, MD 08/25/17 0031

## 2017-08-25 MED ORDER — MUPIROCIN CALCIUM 2 % EX CREA: 1.0000 "application " | TOPICAL_CREAM | Freq: Two times a day (BID) | CUTANEOUS | 0 refills | Status: DC

## 2017-08-25 MED ORDER — IBUPROFEN 600 MG PO TABS: 600.0000 mg | ORAL_TABLET | Freq: Three times a day (TID) | ORAL | 0 refills | Status: DC | PRN

## 2017-08-25 MED ORDER — TRAMADOL HCL 50 MG PO TABS: 50.0000 mg | ORAL_TABLET | Freq: Four times a day (QID) | ORAL | 0 refills | Status: DC | PRN

## 2017-08-25 MED ORDER — CLINDAMYCIN HCL 300 MG PO CAPS: 300.0000 mg | ORAL_CAPSULE | Freq: Three times a day (TID) | ORAL | 0 refills | Status: AC

## 2018-01-21 ENCOUNTER — Encounter (HOSPITAL_COMMUNITY): Payer: Self-pay

## 2018-01-21 ENCOUNTER — Emergency Department (HOSPITAL_COMMUNITY): Payer: Medicare Other

## 2018-01-21 ENCOUNTER — Emergency Department (HOSPITAL_COMMUNITY)
Admission: EM | Admit: 2018-01-21 | Discharge: 2018-01-22 | Disposition: A | Discharge status: Home / Self Care | Payer: Medicare Other | Attending: Emergency Medicine | Admitting: Emergency Medicine

## 2018-01-21 DIAGNOSIS — Z91010 Allergy to peanuts: Secondary | ICD-10-CM | POA: Insufficient documentation

## 2018-01-21 DIAGNOSIS — J45909 Unspecified asthma, uncomplicated: Secondary | ICD-10-CM | POA: Insufficient documentation

## 2018-01-21 DIAGNOSIS — F17210 Nicotine dependence, cigarettes, uncomplicated: Secondary | ICD-10-CM | POA: Diagnosis not present

## 2018-01-21 DIAGNOSIS — R109 Unspecified abdominal pain: Secondary | ICD-10-CM

## 2018-01-21 DIAGNOSIS — R1031 Right lower quadrant pain: Secondary | ICD-10-CM | POA: Diagnosis not present

## 2018-01-21 LAB — CBC WITH DIFFERENTIAL/PLATELET
Abs Immature Granulocytes: 0.01 10*3/uL (ref 0.00–0.07)
Basophils Absolute: 0 10*3/uL (ref 0.0–0.1)
Basophils Relative: 0 %
Eosinophils Absolute: 0 10*3/uL (ref 0.0–0.5)
Eosinophils Relative: 1 %
HCT: 50 % (ref 39.0–52.0)
Hemoglobin: 16.4 g/dL (ref 13.0–17.0)
Immature Granulocytes: 0 %
Lymphocytes Relative: 36 %
Lymphs Abs: 1.6 10*3/uL (ref 0.7–4.0)
MCH: 28.8 pg (ref 26.0–34.0)
MCHC: 32.8 g/dL (ref 30.0–36.0)
MCV: 87.7 fL (ref 80.0–100.0)
Monocytes Absolute: 0.3 10*3/uL (ref 0.1–1.0)
Monocytes Relative: 7 %
Neutro Abs: 2.5 10*3/uL (ref 1.7–7.7)
Neutrophils Relative %: 56 %
Platelets: 133 10*3/uL — ABNORMAL LOW (ref 150–400)
RBC: 5.7 MIL/uL (ref 4.22–5.81)
RDW: 13.2 % (ref 11.5–15.5)
WBC: 4.5 10*3/uL (ref 4.0–10.5)
nRBC: 0 % (ref 0.0–0.2)

## 2018-01-21 LAB — COMPREHENSIVE METABOLIC PANEL
ALT: 10 U/L (ref 0–44)
AST: 20 U/L (ref 15–41)
Albumin: 4.6 g/dL (ref 3.5–5.0)
Alkaline Phosphatase: 63 U/L (ref 38–126)
Anion gap: 9 (ref 5–15)
BUN: 22 mg/dL — ABNORMAL HIGH (ref 6–20)
CO2: 29 mmol/L (ref 22–32)
Calcium: 9.6 mg/dL (ref 8.9–10.3)
Chloride: 105 mmol/L (ref 98–111)
Creatinine, Ser: 1.12 mg/dL (ref 0.61–1.24)
GFR calc Af Amer: >60 mL/min (ref >60)
GFR calc non Af Amer: >60 mL/min (ref >60)
Glucose, Bld: 80 mg/dL (ref 70–99)
Potassium: 4.1 mmol/L (ref 3.5–5.1)
Sodium: 143 mmol/L (ref 135–145)
Total Bilirubin: 1.1 mg/dL (ref 0.3–1.2)
Total Protein: 7.5 g/dL (ref 6.5–8.1)

## 2018-01-21 LAB — URINALYSIS, ROUTINE W REFLEX MICROSCOPIC
Bilirubin Urine: NEGATIVE
Glucose, UA: NEGATIVE mg/dL
Hgb urine dipstick: NEGATIVE
Ketones, ur: NEGATIVE mg/dL
Leukocytes, UA: NEGATIVE
Nitrite: NEGATIVE
Protein, ur: NEGATIVE mg/dL
Specific Gravity, Urine: 1.025 (ref 1.005–1.030)
pH: 7 (ref 5.0–8.0)

## 2018-01-21 LAB — LIPASE, BLOOD: Lipase: 23 U/L (ref 11–51)

## 2018-01-21 IMAGING — CT CT RENAL STONE PROTOCOL
2 of 4 series · 16 of 46 positions shown, 18 images · non-contrast
Comparison: Abdominal ultrasound dated [DATE]

CLINICAL DATA: 22-year-old male with right flank pain.

EXAM:
CT ABDOMEN AND PELVIS WITHOUT CONTRAST
TECHNIQUE: Multidetector CT imaging of the abdomen and pelvis was performed
following the standard protocol without IV contrast.

[Series 2: axial st · axial · 0.61mm/px · z∈[-132,+228]mm · 13 of 81 slices shown, 15 images]
[im 5/81  soft-tissue]
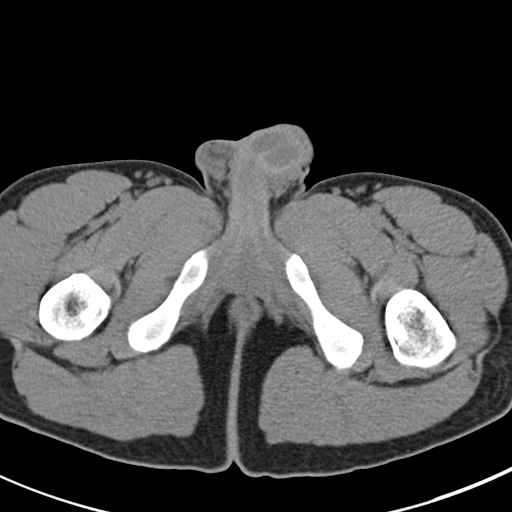
[im 5/81  bone]
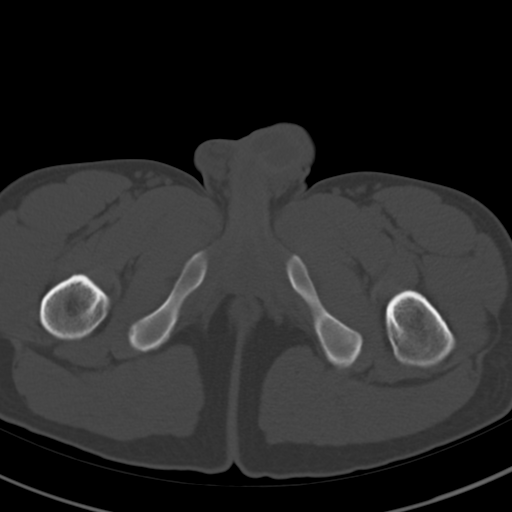
[im 13/81  soft-tissue]
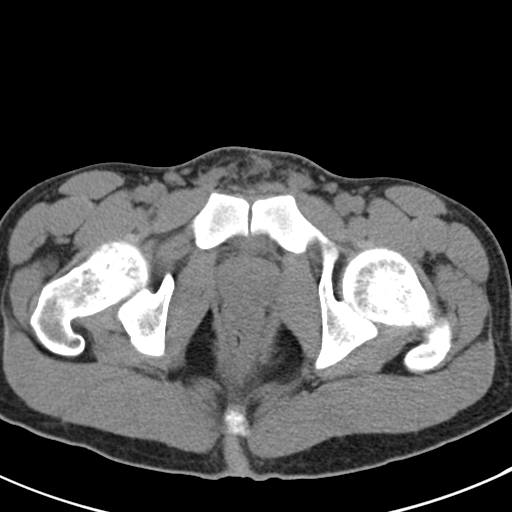
[im 17/81  soft-tissue]
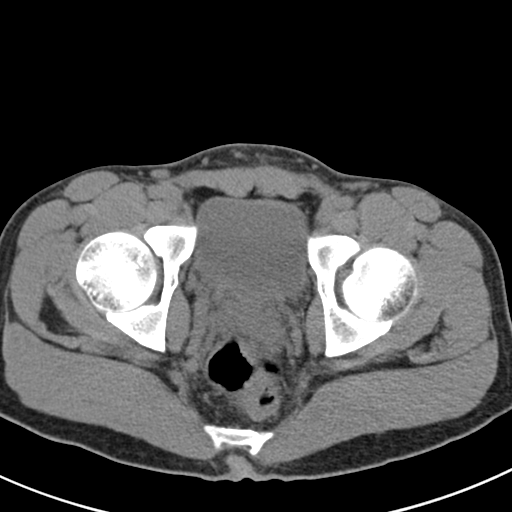
[im 25/81  soft-tissue]
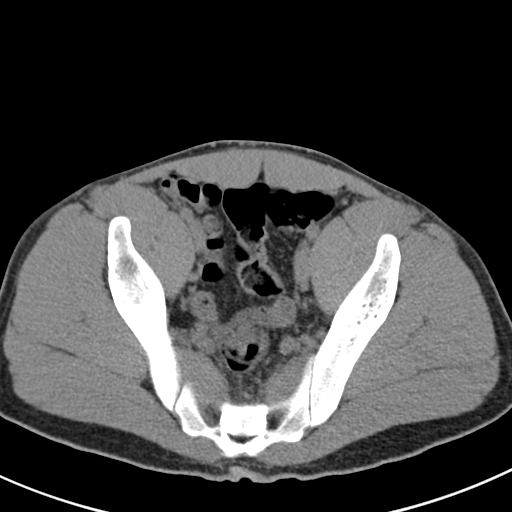
[im 29/81  soft-tissue]
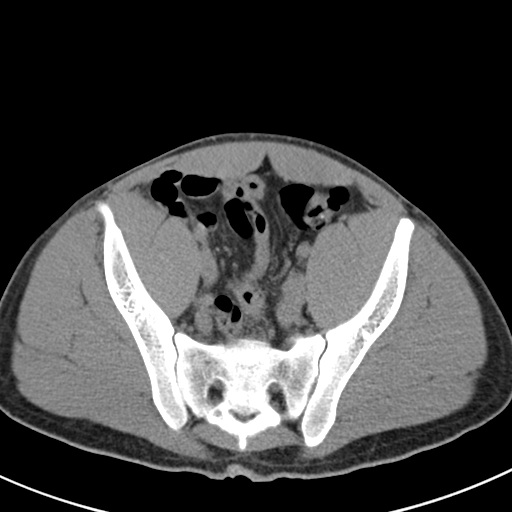
[im 37/81  soft-tissue]
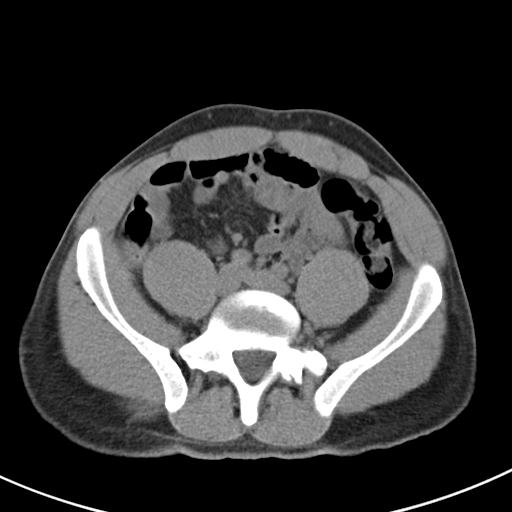
[im 41/81  soft-tissue]
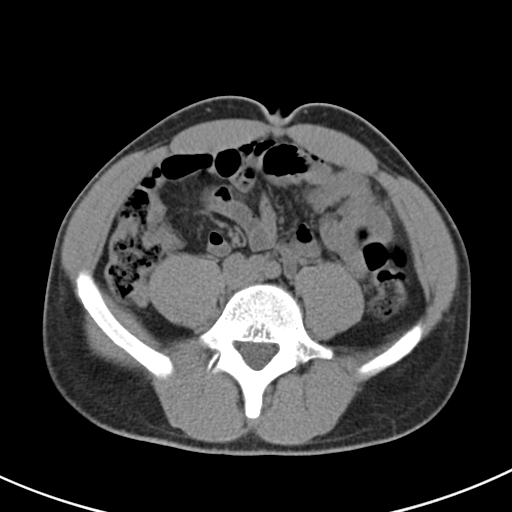
[im 45/81  soft-tissue]
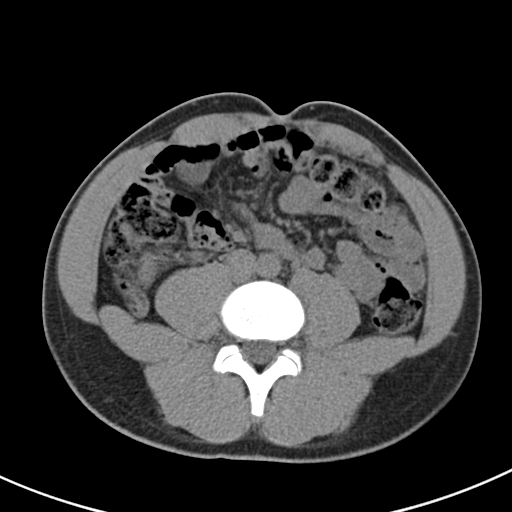
[im 53/81  soft-tissue]
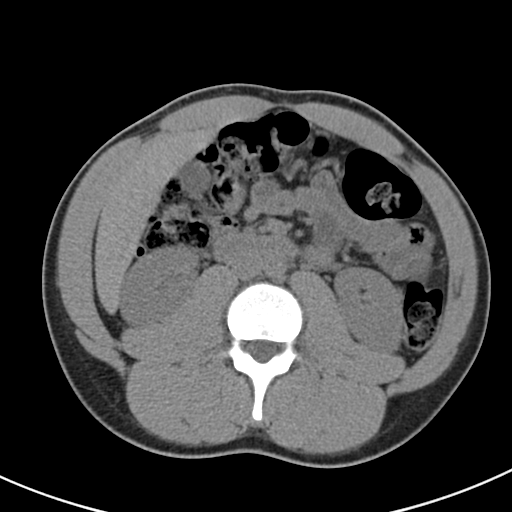
[im 53/81  bone]
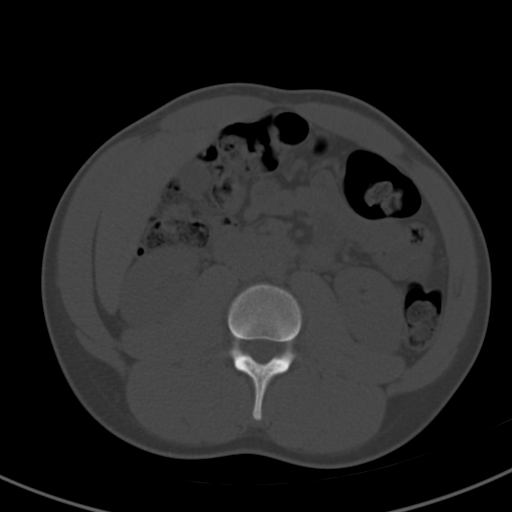
[im 57/81  soft-tissue]
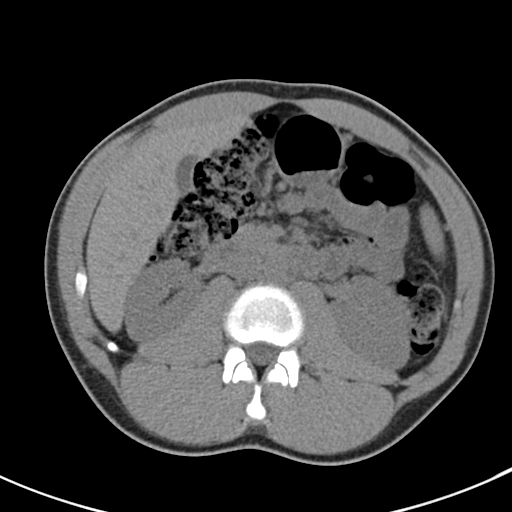
[im 65/81  soft-tissue]
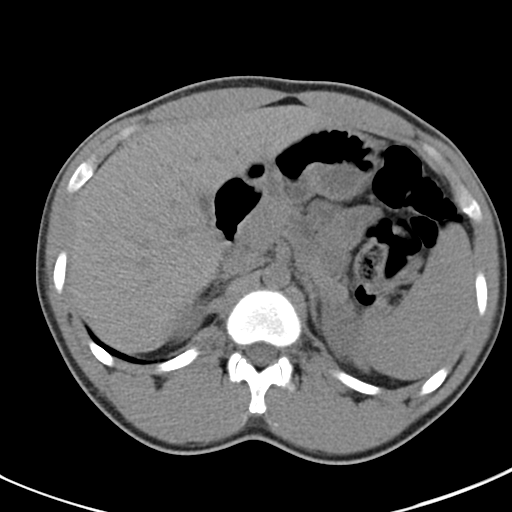
[im 69/81  soft-tissue]
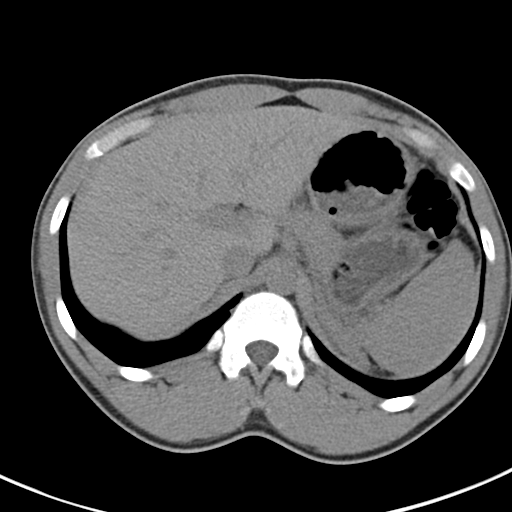
[im 77/81  soft-tissue]
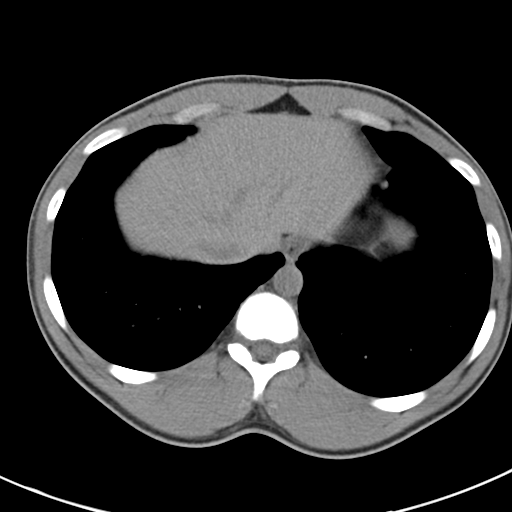

[Series 5: coronal · coronal · 0.60mm/px · 3 of 110 slices shown]
[im 37/110  soft-tissue]
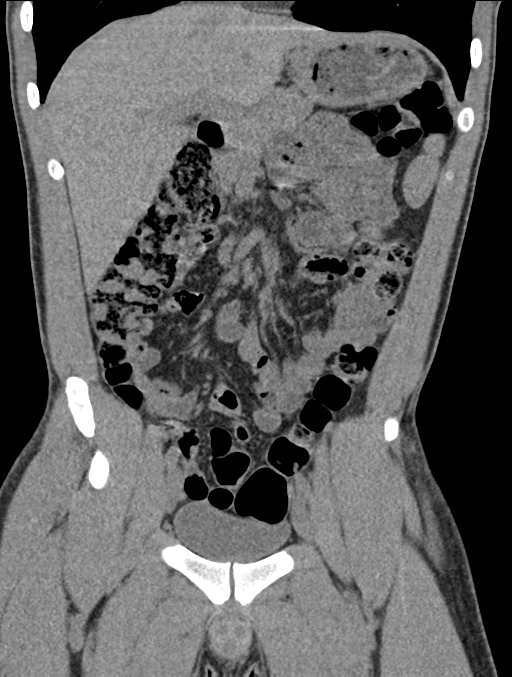
[im 49/110  soft-tissue]
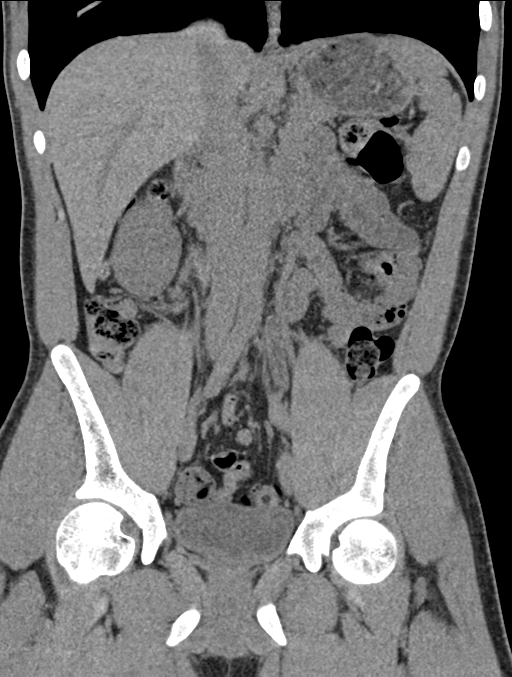
[im 61/110  soft-tissue]
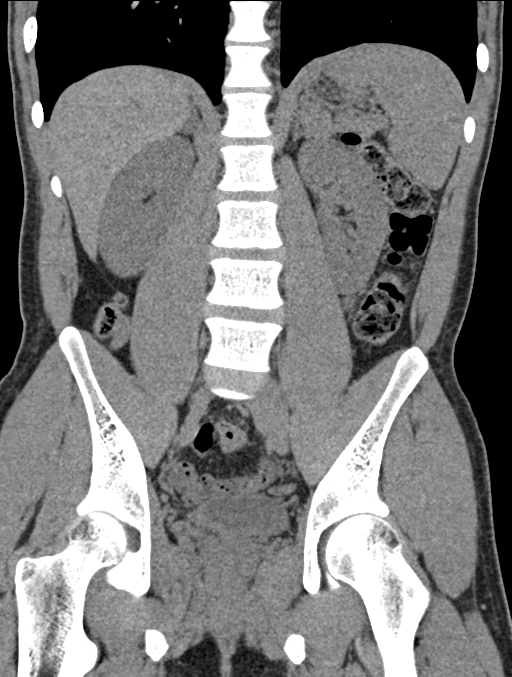

[16 of 46 positions shown; findings below may reference images not displayed]

FINDINGS: Evaluation of this exam is limited in the absence of intravenous
contrast.

Lower chest: The visualized lung bases are clear.

No intra-abdominal free air or free fluid.

Hepatobiliary: No focal liver abnormality is seen. No gallstones,
gallbladder wall thickening, or biliary dilatation.

Pancreas: The pancreas is grossly unremarkable as visualized.

Spleen: Normal in size without focal abnormality.

Adrenals/Urinary Tract: Adrenal glands are unremarkable. Kidneys are
normal, without renal calculi, focal lesion, or hydronephrosis.
Bladder is unremarkable.

Stomach/Bowel: Stomach is within normal limits. Appendix appears
normal. No evidence of bowel wall thickening, distention, or
inflammatory changes.

Vascular/Lymphatic: No significant vascular findings are present. No
enlarged abdominal or pelvic lymph nodes.

Reproductive: The prostate and seminal vesicles are grossly
unremarkable.

Other: None

Musculoskeletal: No acute or significant osseous findings.
IMPRESSION: No acute intra-abdominal or pelvic pathology. No hydronephrosis or
nephrolithiasis.

## 2018-01-21 NOTE — ED Triage Notes (Signed)
Patient c/o left abdominal pain that radiates to left lower back that started x1 day ago.  Patient states pain is worse with urination with burning.  7/10 pain  Denies n/v or diarrhea.   A/Ox4 Ambulatory in triage.

## 2018-01-21 NOTE — ED Provider Notes (Signed)
[facility]-EMERGENCY DEPT Provider Note   CSN: [id number] Arrival date & time: 01/21/18  1559     History   Chief Complaint Chief Complaint  Patient presents with  . Flank Pain    HPI Philip Ramos is a 22 y.o. male.  The history is provided by the patient and medical records.  Flank Pain     22 y.o. M with history of asthma, presenting to the ED for right flank pain.  Patient reports this has been ongoing for about 24 hours now.  Pain localized to right flank with radiation down into the groin.  Denies testicle pain/swelling.  He reports some discomfort and pressure like sensation when urinating but no dysuria or hematuria.  No penile discharge or concern for STD.  No fever, chills, sweats.  He did vomit once yesterday but felt that was related to abnormal food intake.  No hx of kidney stones.  Reports his father died from complications from renal cancer.  No meds tried PTA.  Past Medical History:  Diagnosis Date  . Asthma     There are no active problems to display for this patient.   History reviewed. No pertinent surgical history.      Home Medications    Prior to Admission medications   Medication Sig Start Date End Date Taking? Authorizing Provider  ibuprofen (ADVIL,MOTRIN) 200 MG tablet Take 400 mg by mouth every 6 (six) hours as needed for moderate pain.   Yes [provider]  ibuprofen (ADVIL,MOTRIN) 600 MG tablet Take 1 tablet (600 mg total) by mouth every 8 (eight) hours as needed for moderate pain. Patient not taking: Reported on 01/21/2018 08/25/17   Shaune Pollack, MD  loratadine (CLARITIN) 10 MG tablet Take 1 tablet (10 mg total) by mouth daily. Patient not taking: Reported on 08/24/2017 08/17/17   Janne Napoleon, NP  mupirocin cream (BACTROBAN) 2 % Apply 1 application topically 2 (two) times daily. Patient not taking: Reported on 01/21/2018 08/25/17   Shaune Pollack, MD  naproxen (NAPROSYN) 375 MG tablet Take 1 tablet twice  daily as needed for heel pain. Patient not taking: Reported on 08/24/2017 05/30/17   Molpus, Jonny Ruiz, MD  predniSONE (DELTASONE) 10 MG tablet Take 4 tablets (40 mg total) by mouth daily. Patient not taking: Reported on 01/21/2018 08/17/17   Janne Napoleon, NP  ranitidine (ZANTAC) 150 MG tablet Take 1 tablet (150 mg total) by mouth 2 (two) times daily. Patient not taking: Reported on 01/21/2018 08/17/17   Janne Napoleon, NP  traMADol (ULTRAM) 50 MG tablet Take 1 tablet (50 mg total) by mouth every 6 (six) hours as needed for severe pain. Patient not taking: Reported on 01/21/2018 08/25/17   Shaune Pollack, MD    Family History Family History  Problem Relation Age of Onset  . Hypertension Other   . Diabetes Other   . Cancer Other   . Cancer Mother   . Diabetes Mother   . Cancer Father   . Diabetes Father     Social History Social History   Tobacco Use  . Smoking status: Current Every Day Smoker    Packs/day: 1.00    Types: Cigarettes  . Smokeless tobacco: Never Used  . Tobacco comment: smokes black and milds  Substance Use Topics  . Alcohol use: Yes    Alcohol/week: 1.0 standard drinks    Types: 1 Cans of beer per week    Comment: occ  . Drug use: No  Allergies   Amoxicillin; Peanut-containing drug products; Penicillins; and Shellfish allergy   Review of Systems Review of Systems  Genitourinary: Positive for flank pain.  All other systems reviewed and are negative.    Physical Exam Updated Vital Signs BP 110/60 (BP Location: Right Arm)   Pulse 60   Temp 98.1 F (36.7 C) (Oral)   Resp 13   SpO2 99%   Physical Exam  Constitutional: He is oriented to person, place, and time. He appears well-developed and well-nourished.  HENT:  Head: Normocephalic and atraumatic.  Mouth/Throat: Oropharynx is clear and moist.  Eyes: Pupils are equal, round, and reactive to light. Conjunctivae and EOM are normal.  Neck: Normal range of motion.  Cardiovascular: Normal rate, regular  rhythm and normal heart sounds.  Pulmonary/Chest: Effort normal and breath sounds normal. No stridor. No respiratory distress.  Abdominal: Soft. Bowel sounds are normal. There is no tenderness. There is no rebound.  No focal CVA tenderness  Musculoskeletal: Normal range of motion.  Neurological: He is alert and oriented to person, place, and time.  Skin: Skin is warm and dry.  Psychiatric: He has a normal mood and affect.  Nursing note and vitals reviewed.    ED Treatments / Results  Labs (all labs ordered are listed, but only abnormal results are displayed) Labs Reviewed  COMPREHENSIVE METABOLIC PANEL - Abnormal; Notable for the following components:      Result Value   BUN 22 (*)    All other components within normal limits  CBC WITH DIFFERENTIAL/PLATELET - Abnormal; Notable for the following components:   Platelets 133 (*)    All other components within normal limits  LIPASE, BLOOD  URINALYSIS, ROUTINE W REFLEX MICROSCOPIC    EKG None  Radiology Ct Renal Stone Study  Result Date: 01/22/2018 CLINICAL DATA:  22 year old male with right flank pain. EXAM: CT ABDOMEN AND PELVIS WITHOUT CONTRAST TECHNIQUE: Multidetector CT imaging of the abdomen and pelvis was performed following the standard protocol without IV contrast. COMPARISON:  Abdominal ultrasound dated 11/26/2013 FINDINGS: Evaluation of this exam is limited in the absence of intravenous contrast. Lower chest: The visualized lung bases are clear. No intra-abdominal free air or free fluid. Hepatobiliary: No focal liver abnormality is seen. No gallstones, gallbladder wall thickening, or biliary dilatation. Pancreas: The pancreas is grossly unremarkable as visualized. Spleen: Normal in size without focal abnormality. Adrenals/Urinary Tract: Adrenal glands are unremarkable. Kidneys are normal, without renal calculi, focal lesion, or hydronephrosis. Bladder is unremarkable. Stomach/Bowel: Stomach is within normal limits. Appendix  appears normal. No evidence of bowel wall thickening, distention, or inflammatory changes. Vascular/Lymphatic: No significant vascular findings are present. No enlarged abdominal or pelvic lymph nodes. Reproductive: The prostate and seminal vesicles are grossly unremarkable. Other: None Musculoskeletal: No acute or significant osseous findings. IMPRESSION: No acute intra-abdominal or pelvic pathology. No hydronephrosis or nephrolithiasis. Electronically Signed   By: Elgie CollardArash  Radparvar M.D.   On: 01/22/2018 00:22    Procedures Procedures (including critical care time)  Medications Ordered in ED Medications - No data to display   Initial Impression / Assessment and Plan / ED Course  I have reviewed the triage vital signs and the nursing notes.  Pertinent labs & imaging results that were available during my care of the patient were reviewed by me and considered in my medical decision making (see chart for details).  22 y.o. M here with right flank pain for the past 24 hours.  Pain radiates into the groin, some pressure when urinating.  Denies history of kidney stones.  He is afebrile, non-toxic.  He is sitting in bed with his son, pain notable with movement.  VSS.  Labs reassuring.  Plan for CT renal study for further evaluation.  CT renal study negative.  Etiology of pain unknown at this point, possible recently passed small stone versus musculoskeletal.  Patient has remained comfortable here and has not required any pain medications.  Will discharge home with symptomatic care.  Not currently have PCP or insurance, will refer to patient care center for follow-up.  He can return here for any new or worsening symptoms.  Final Clinical Impressions(s) / ED Diagnoses   Final diagnoses:  Flank pain    ED Discharge Orders         Ordered    naproxen (NAPROSYN) 500 MG tablet  2 times daily with meals     01/22/18 0142    methocarbamol (ROBAXIN) 500 MG tablet  2 times daily     01/22/18 0142             Garlon Hatchet, PA-C 01/22/18 0259    Loren Racer, MD 01/24/18 1521

## 2018-01-22 ENCOUNTER — Other Ambulatory Visit: Payer: Self-pay

## 2018-01-22 DIAGNOSIS — R109 Unspecified abdominal pain: Secondary | ICD-10-CM | POA: Diagnosis not present

## 2018-01-22 MED ORDER — METHOCARBAMOL 500 MG PO TABS: 500.0000 mg | ORAL_TABLET | Freq: Two times a day (BID) | ORAL | 0 refills | Status: DC

## 2018-01-22 MED ORDER — NAPROXEN 500 MG PO TABS: 500.0000 mg | ORAL_TABLET | Freq: Two times a day (BID) | ORAL | 0 refills | Status: DC

## 2018-01-22 NOTE — Discharge Instructions (Signed)
Labs and CT scan today were all normal.  Pain may be musculoskeletal vs from recently passed kidney stone. Take the prescribed medication as directed. Follow-up with the patient care center like we talked about--call in the morning for appt. Return to the ED for new or worsening symptoms.

## 2018-05-07 ENCOUNTER — Other Ambulatory Visit: Payer: Self-pay

## 2018-05-07 ENCOUNTER — Emergency Department (HOSPITAL_COMMUNITY): Payer: Medicare Other

## 2018-05-07 ENCOUNTER — Emergency Department (HOSPITAL_COMMUNITY)
Admission: EM | Admit: 2018-05-07 | Discharge: 2018-05-07 | Disposition: A | Discharge status: Home / Self Care | Payer: Medicare Other | Attending: Emergency Medicine | Admitting: Emergency Medicine

## 2018-05-07 ENCOUNTER — Encounter (HOSPITAL_COMMUNITY): Payer: Self-pay | Admitting: Emergency Medicine

## 2018-05-07 DIAGNOSIS — R079 Chest pain, unspecified: Secondary | ICD-10-CM | POA: Diagnosis not present

## 2018-05-07 DIAGNOSIS — F17210 Nicotine dependence, cigarettes, uncomplicated: Secondary | ICD-10-CM | POA: Diagnosis not present

## 2018-05-07 DIAGNOSIS — R0789 Other chest pain: Secondary | ICD-10-CM | POA: Diagnosis not present

## 2018-05-07 DIAGNOSIS — J45909 Unspecified asthma, uncomplicated: Secondary | ICD-10-CM | POA: Insufficient documentation

## 2018-05-07 DIAGNOSIS — J4521 Mild intermittent asthma with (acute) exacerbation: Secondary | ICD-10-CM | POA: Insufficient documentation

## 2018-05-07 DIAGNOSIS — Z91010 Allergy to peanuts: Secondary | ICD-10-CM | POA: Insufficient documentation

## 2018-05-07 DIAGNOSIS — R062 Wheezing: Secondary | ICD-10-CM | POA: Diagnosis not present

## 2018-05-07 DIAGNOSIS — R05 Cough: Secondary | ICD-10-CM | POA: Insufficient documentation

## 2018-05-07 DIAGNOSIS — F17290 Nicotine dependence, other tobacco product, uncomplicated: Secondary | ICD-10-CM | POA: Diagnosis not present

## 2018-05-07 DIAGNOSIS — R0602 Shortness of breath: Secondary | ICD-10-CM | POA: Diagnosis not present

## 2018-05-07 LAB — COMPREHENSIVE METABOLIC PANEL
ALT: 18 U/L (ref 0–44)
AST: 25 U/L (ref 15–41)
Albumin: 4.1 g/dL (ref 3.5–5.0)
Alkaline Phosphatase: 69 U/L (ref 38–126)
Anion gap: 8 (ref 5–15)
BUN: 13 mg/dL (ref 6–20)
CO2: 27 mmol/L (ref 22–32)
Calcium: 8.7 mg/dL — ABNORMAL LOW (ref 8.9–10.3)
Chloride: 105 mmol/L (ref 98–111)
Creatinine, Ser: 1.01 mg/dL (ref 0.61–1.24)
GFR calc Af Amer: >60 mL/min (ref >60)
GFR calc non Af Amer: >60 mL/min (ref >60)
Glucose, Bld: 91 mg/dL (ref 70–99)
Potassium: 3.7 mmol/L (ref 3.5–5.1)
Sodium: 140 mmol/L (ref 135–145)
Total Bilirubin: 0.5 mg/dL (ref 0.3–1.2)
Total Protein: 6.9 g/dL (ref 6.5–8.1)

## 2018-05-07 LAB — CBC
HCT: 46 % (ref 39.0–52.0)
Hemoglobin: 15.6 g/dL (ref 13.0–17.0)
MCH: 29.5 pg (ref 26.0–34.0)
MCHC: 33.9 g/dL (ref 30.0–36.0)
MCV: 87.1 fL (ref 80.0–100.0)
Platelets: 152 10*3/uL (ref 150–400)
RBC: 5.28 MIL/uL (ref 4.22–5.81)
RDW: 13.2 % (ref 11.5–15.5)
WBC: 5 10*3/uL (ref 4.0–10.5)
nRBC: 0 % (ref 0.0–0.2)

## 2018-05-07 IMAGING — CR CHEST - 2 VIEW
2 series · 2 of 2 positions shown · non-contrast
Comparison: [DATE]

CLINICAL DATA: Left chest pain

EXAM:
CHEST - 2 VIEW

[w chest pa]
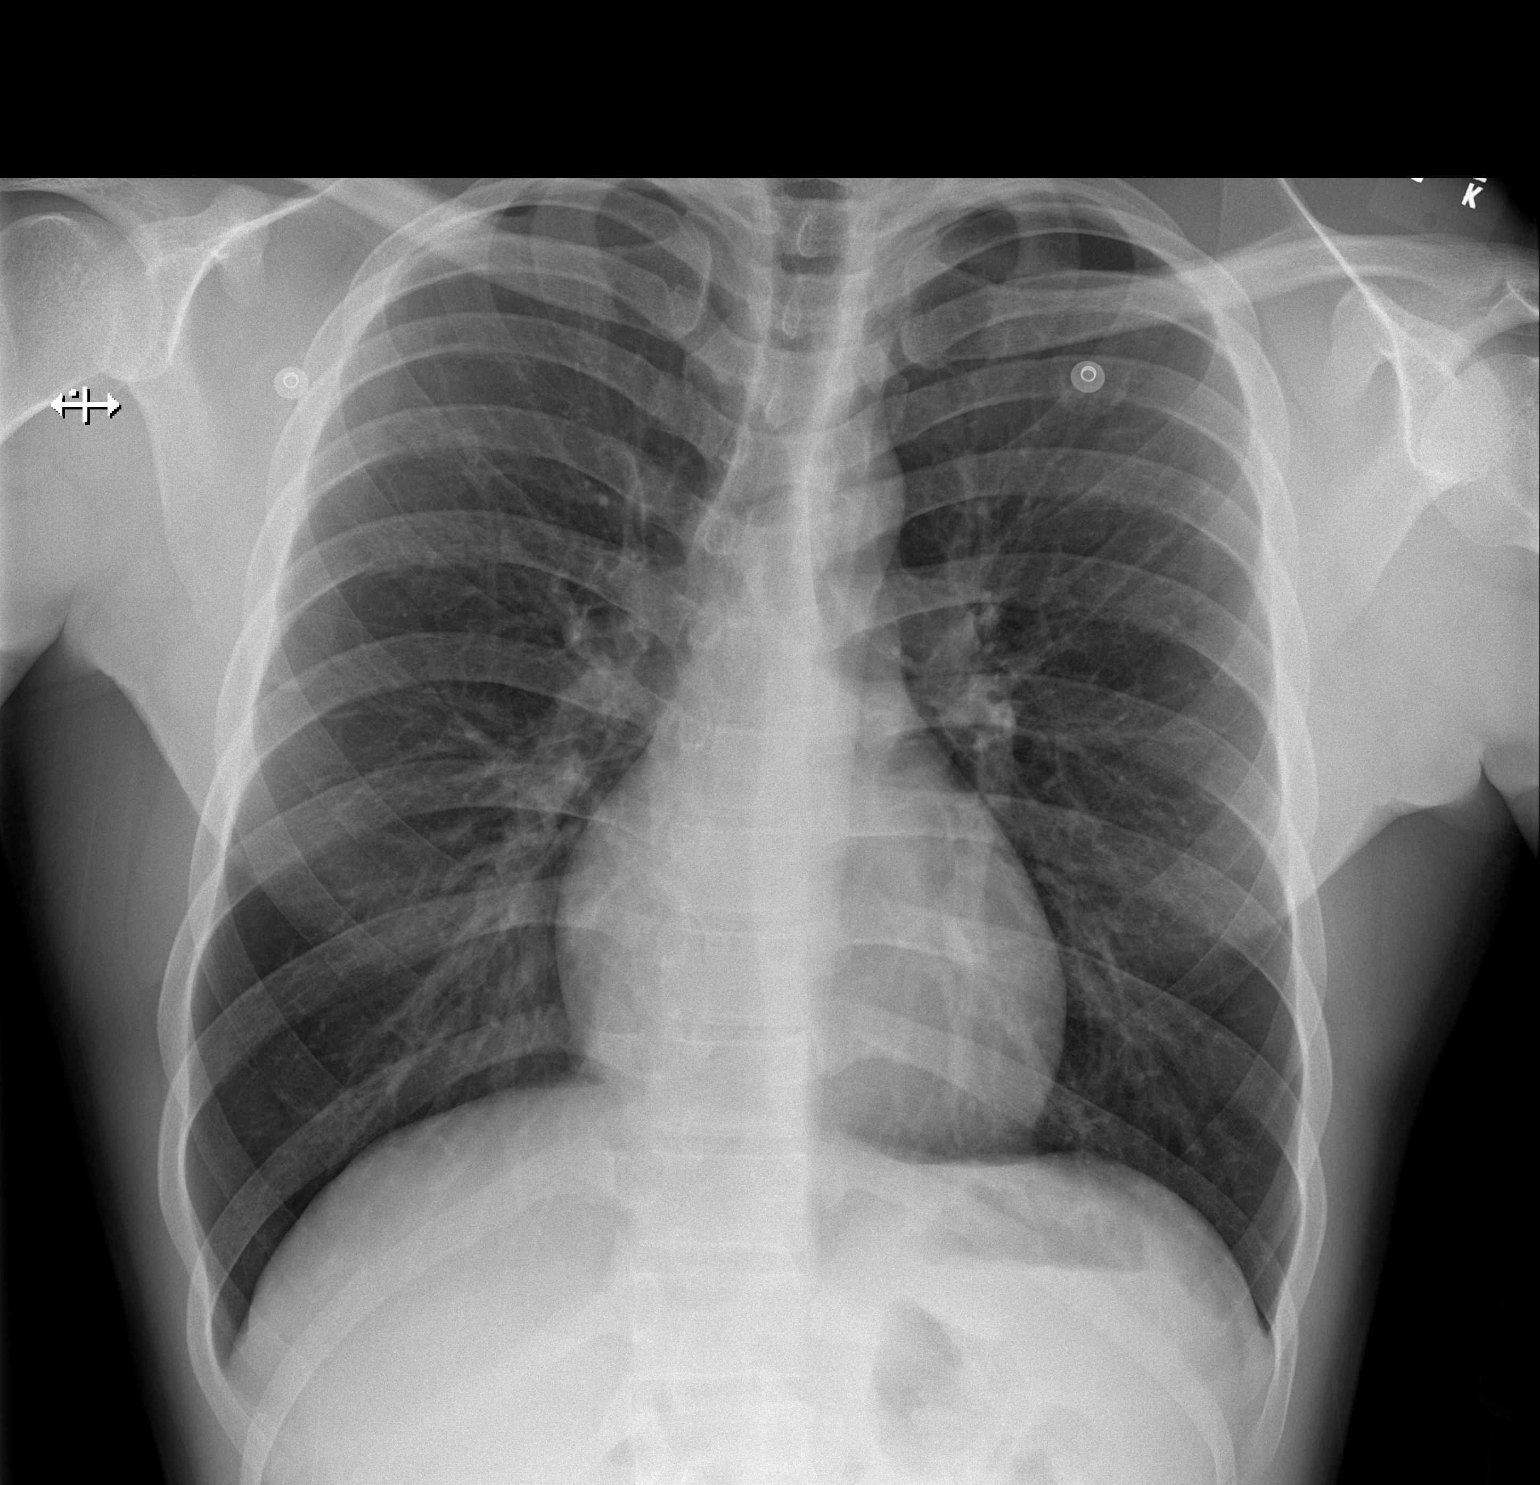

[w chest lat]
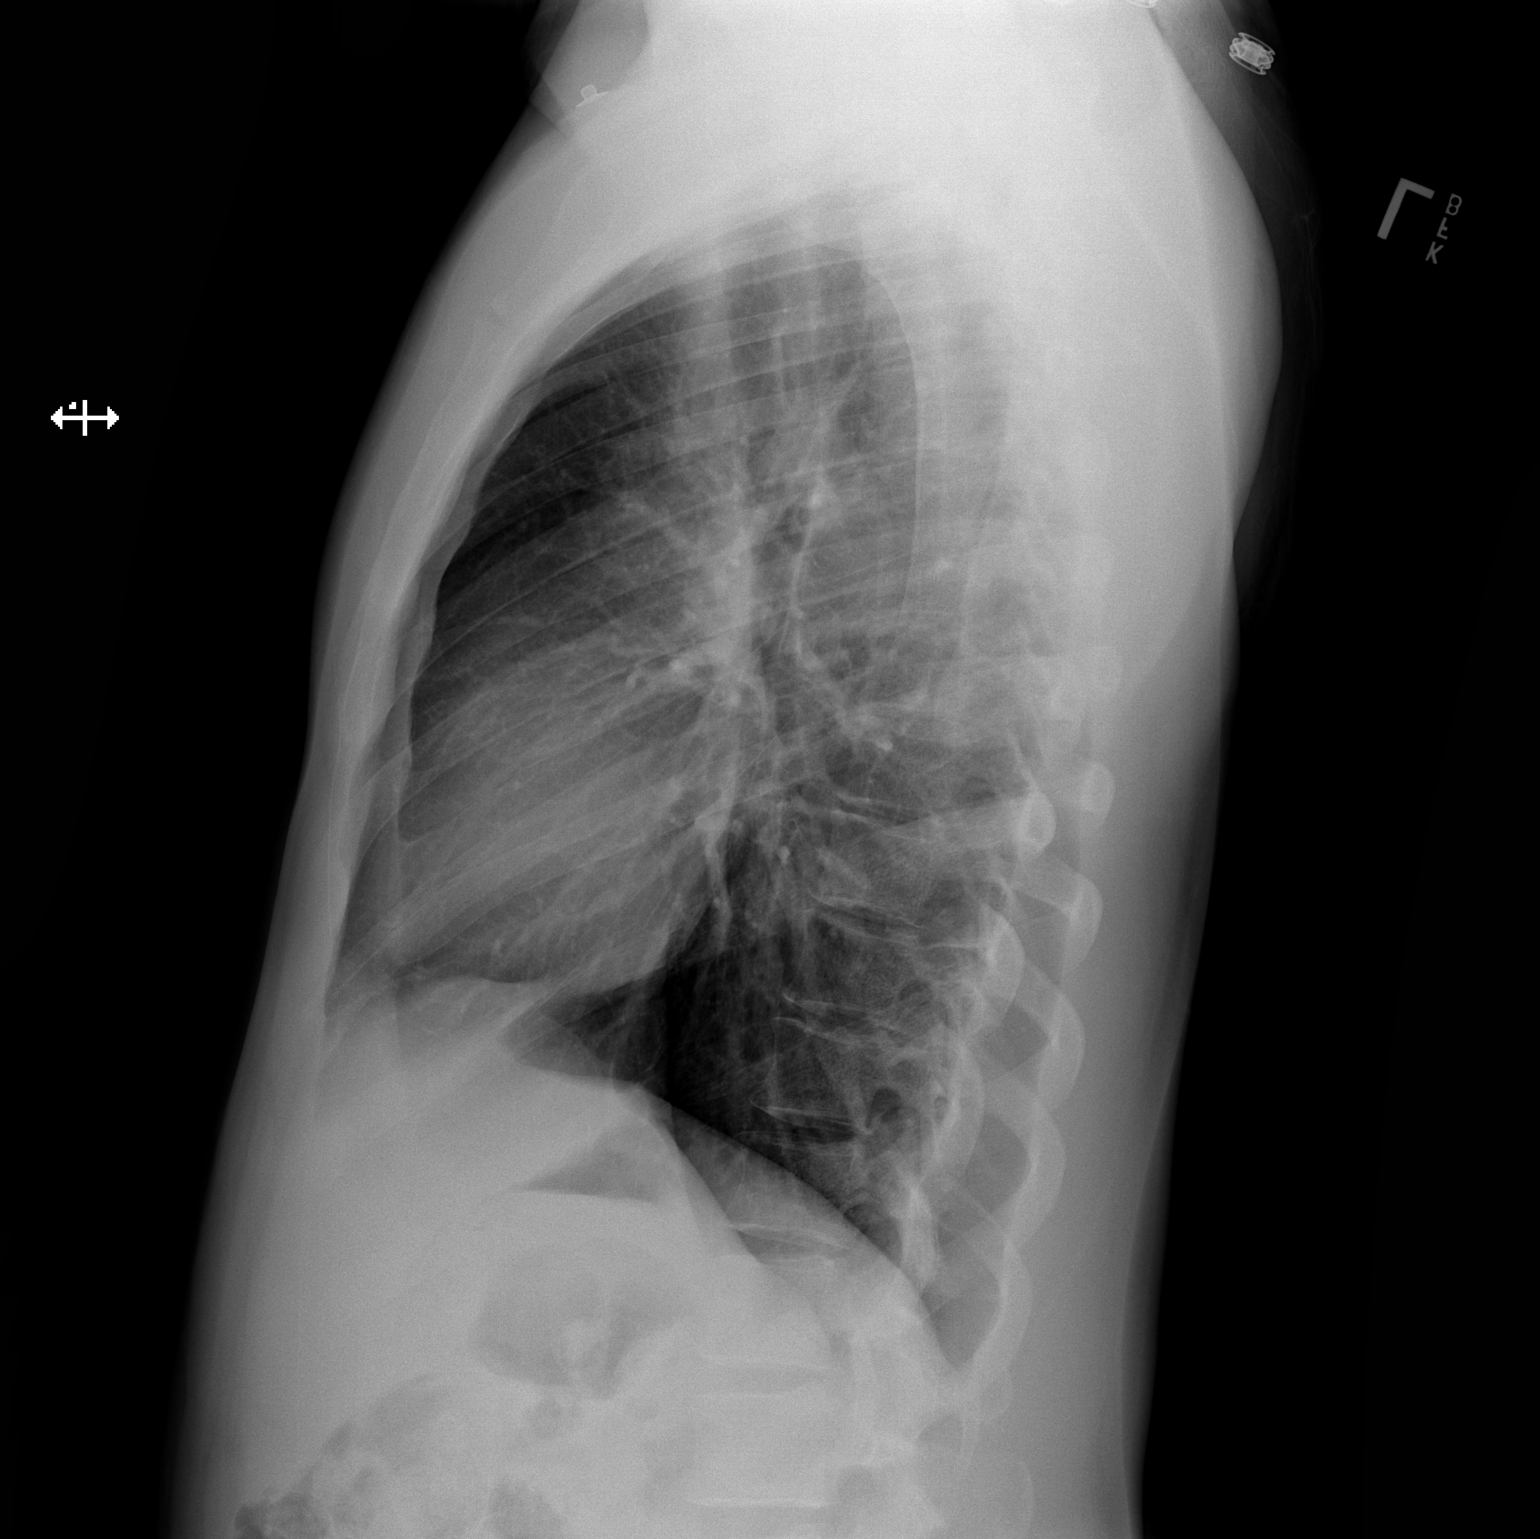

[2 of 2 positions shown; findings below may reference images not displayed]

FINDINGS: The heart size and mediastinal contours are within normal limits.
Both lungs are clear. The visualized skeletal structures are
unremarkable.
IMPRESSION: No active cardiopulmonary disease.

## 2018-05-07 MED ORDER — ALBUTEROL SULFATE HFA 108 (90 BASE) MCG/ACT IN AERS: 1.0000 | INHALATION_SPRAY | Freq: Four times a day (QID) | RESPIRATORY_TRACT | 0 refills | Status: DC | PRN

## 2018-05-07 NOTE — ED Triage Notes (Signed)
Per [facility] pt from home for SOB. Pt has asthma and inhaler ran out couple months ago. Pt was yard work and pollen been giving hard time.  Pt came in with neb treatment running-albuterol 5mg . 20g left AC Vitals: 80-90HR, 99% on RA, CBG 106.

## 2018-05-07 NOTE — ED Notes (Signed)
ED Provider at bedside. 

## 2018-05-07 NOTE — ED Triage Notes (Signed)
Pt adds that for years he had left lateral side pains. When had it checked out before never found anything. Reports has vomiting at times when pains get really bad,

## 2018-05-07 NOTE — ED Provider Notes (Signed)
[facility]-EMERGENCY DEPT Provider Note   CSN: [id number] Arrival date & time: 05/07/18  1803    History   Chief Complaint Chief Complaint  Patient presents with  . Asthma  . Shortness of Breath  . Abdominal Pain    HPI Philip Ramos is a 23 y.o. male.   HPI Pt states he was at work today and felt short of breath.  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  He started to feel short of breath and his left rib area was hurting.  He was given a breathing treatment and that improved it a bit.  No fevers.  He has been coughing.  NO vomiting or diarrhea.  No abdominal pain. No dysuria.  No recent travel.    Past Medical History:  Diagnosis Date  . Asthma     There are no active problems to display for this patient.   History reviewed. No pertinent surgical history.      Home Medications    Prior to Admission medications   Medication Sig Start Date End Date Taking? Authorizing Provider  acetaminophen (TYLENOL) 500 MG tablet Take 500 mg by mouth daily as needed for mild pain.   Yes [provider]  neomycin-bacitracin-polymyxin (NEOSPORIN) ointment Apply 1 application topically daily as needed for wound care.   Yes [provider]  albuterol (PROVENTIL HFA;VENTOLIN HFA) 108 (90 Base) MCG/ACT inhaler Inhale 1-2 puffs into the lungs every 6 (six) hours as needed for wheezing or shortness of breath. 05/07/18   Linwood Dibbles, MD  ibuprofen (ADVIL,MOTRIN) 600 MG tablet Take 1 tablet (600 mg total) by mouth every 8 (eight) hours as needed for moderate pain. Patient not taking: Reported on 05/07/2018 08/25/17   Shaune Pollack, MD  loratadine (CLARITIN) 10 MG tablet Take 1 tablet (10 mg total) by mouth daily. Patient not taking: Reported on 08/24/2017 08/17/17   Janne Napoleon, NP  methocarbamol (ROBAXIN) 500 MG tablet Take 1 tablet (500 mg total) by mouth 2 (two) times daily. Patient not taking: Reported on 05/07/2018 01/22/18    Garlon Hatchet, PA-C  mupirocin cream (BACTROBAN) 2 % Apply 1 application topically 2 (two) times daily. Patient not taking: Reported on 05/07/2018 08/25/17   Shaune Pollack, MD  naproxen (NAPROSYN) 500 MG tablet Take 1 tablet (500 mg total) by mouth 2 (two) times daily with a meal. Patient not taking: Reported on 05/07/2018 01/22/18   Garlon Hatchet, PA-C  predniSONE (DELTASONE) 10 MG tablet Take 4 tablets (40 mg total) by mouth daily. Patient not taking: Reported on 05/07/2018 08/17/17   Janne Napoleon, NP  ranitidine (ZANTAC) 150 MG tablet Take 1 tablet (150 mg total) by mouth 2 (two) times daily. Patient not taking: Reported on 05/07/2018 08/17/17   Janne Napoleon, NP  traMADol (ULTRAM) 50 MG tablet Take 1 tablet (50 mg total) by mouth every 6 (six) hours as needed for severe pain. Patient not taking: Reported on 05/07/2018 08/25/17   Shaune Pollack, MD    Family History Family History  Problem Relation Age of Onset  . Hypertension Other   . Diabetes Other   . Cancer Other   . Cancer Mother   . Diabetes Mother   . Cancer Father   . Diabetes Father     Social History Social History   Tobacco Use  . Smoking status: Current Every Day Smoker    Packs/day: 1.00    Types: Cigarettes, Cigars  .  Smokeless tobacco: Never Used  . Tobacco comment: smokes black and milds  Substance Use Topics  . Alcohol use: Yes    Alcohol/week: 1.0 standard drinks    Types: 1 Cans of beer per week    Comment: occ  . Drug use: No     Allergies   Amoxicillin; Peanut-containing drug products; Penicillins; and Shellfish allergy   Review of Systems Review of Systems  All other systems reviewed and are negative.    Physical Exam Updated Vital Signs BP 131/74 (BP Location: Right Arm)   Pulse 79   Temp 97.9 F (36.6 C) (Oral)   Resp 15   Ht 1.778 m (5\' 10" )   Wt 69.9 kg   SpO2 98%   BMI 22.10 kg/m   Physical Exam Vitals signs and nursing note reviewed.  Constitutional:      General: He is  not in acute distress.    Appearance: He is well-developed.  HENT:     Head: Normocephalic and atraumatic.     Right Ear: External ear normal.     Left Ear: External ear normal.  Eyes:     General: No scleral icterus.       Right eye: No discharge.        Left eye: No discharge.     Conjunctiva/sclera: Conjunctivae normal.  Neck:     Musculoskeletal: Neck supple.     Trachea: No tracheal deviation.  Cardiovascular:     Rate and Rhythm: Normal rate and regular rhythm.  Pulmonary:     Effort: Pulmonary effort is normal. No respiratory distress.     Breath sounds: Normal breath sounds. No stridor. No wheezing or rales.  Abdominal:     General: Bowel sounds are normal. There is no distension.     Palpations: Abdomen is soft.     Tenderness: There is no abdominal tenderness. There is no guarding or rebound.  Musculoskeletal:        General: No tenderness.  Skin:    General: Skin is warm and dry.     Findings: No rash.  Neurological:     Mental Status: He is alert.     Cranial Nerves: No cranial nerve deficit (no facial droop, extraocular movements intact, no slurred speech).     Sensory: No sensory deficit.     Motor: No abnormal muscle tone or seizure activity.     Coordination: Coordination normal.      ED Treatments / Results  Labs (all labs ordered are listed, but only abnormal results are displayed) Labs Reviewed  COMPREHENSIVE METABOLIC PANEL - Abnormal; Notable for the following components:      Result Value   Calcium 8.7 (*)    All other components within normal limits  CBC    EKG EKG Interpretation  Date/Time:  Tuesday May 07 2018 19:12:30 EDT Ventricular Rate:  62 PR Interval:    QRS Duration: 99 QT Interval:  387 QTC Calculation: 393 R Axis:   82 Text Interpretation:  Sinus rhythm Consider left atrial enlargement ST elevation suggests acute pericarditis No significant change since last tracing Confirmed by Linwood Dibbles [phone]) on 05/07/2018 7:40:01 PM    Radiology Dg Chest 2 View  Result Date: 05/07/2018 CLINICAL DATA:  Left chest pain EXAM: CHEST - 2 VIEW COMPARISON:  07/19/2017 FINDINGS: The heart size and mediastinal contours are within normal limits. Both lungs are clear. The visualized skeletal structures are unremarkable. IMPRESSION: No active cardiopulmonary disease. Electronically Signed   By: Marlan Palau  M.D.   On: 05/07/2018 18:50    Procedures Procedures (including critical care time)  Medications Ordered in ED Medications - No data to display   Initial Impression / Assessment and Plan / ED Course  I have reviewed the triage vital signs and the nursing notes.  Pertinent labs & imaging results that were available during my care of the patient were reviewed by me and considered in my medical decision making (see chart for details).   Patient presented to the emergency room for evaluation of left-sided chest discomfort as well as an episode of shortness of breath.  Patient symptoms all resolved after breathing treatment prior to arrival.  Laboratory tests chest x-ray and exam are reassuring.  He is not wheezing.  Low risk for PE, PERC negative.  Discharged home with an inhaler rx  Final Clinical Impressions(s) / ED Diagnoses   Final diagnoses:  Mild intermittent asthma with exacerbation    ED Discharge Orders         Ordered    albuterol (PROVENTIL HFA;VENTOLIN HFA) 108 (90 Base) MCG/ACT inhaler  Every 6 hours PRN     05/07/18 2011           Linwood Dibbles, MD 05/07/18 2012

## 2018-05-07 NOTE — ED Notes (Signed)
Pt verbalized discharge instructions and follow up care. Alert and ambulatory. IV removed 

## 2018-05-07 NOTE — Discharge Instructions (Addendum)
Take the medications as prescribed, follow-up with your primary care doctor to make sure you are improving

## 2018-05-07 NOTE — ED Notes (Signed)
Patient transported to X-ray 

## 2018-05-21 ENCOUNTER — Other Ambulatory Visit: Payer: Self-pay

## 2018-05-21 ENCOUNTER — Encounter (HOSPITAL_COMMUNITY): Payer: Self-pay | Admitting: Emergency Medicine

## 2018-05-21 ENCOUNTER — Ambulatory Visit (HOSPITAL_COMMUNITY)
Admission: EM | Admit: 2018-05-21 | Discharge: 2018-05-21 | Disposition: A | Discharge status: Home / Self Care | Payer: Medicare Other | Attending: Family Medicine | Admitting: Family Medicine

## 2018-05-21 DIAGNOSIS — J4521 Mild intermittent asthma with (acute) exacerbation: Secondary | ICD-10-CM | POA: Diagnosis not present

## 2018-05-21 MED ORDER — ALBUTEROL SULFATE HFA 108 (90 BASE) MCG/ACT IN AERS: 1.0000 | INHALATION_SPRAY | Freq: Four times a day (QID) | RESPIRATORY_TRACT | 0 refills | Status: DC | PRN

## 2018-05-21 MED ORDER — ALBUTEROL SULFATE HFA 108 (90 BASE) MCG/ACT IN AERS
INHALATION_SPRAY | RESPIRATORY_TRACT | Status: AC
Start: 2018-05-21 — End: ?
  Filled 2018-05-21: qty 6.7

## 2018-05-21 MED ORDER — ALBUTEROL SULFATE HFA 108 (90 BASE) MCG/ACT IN AERS
2.0000 | INHALATION_SPRAY | Freq: Once | RESPIRATORY_TRACT | Status: AC
  Administered 2018-05-21: 2 via RESPIRATORY_TRACT

## 2018-05-21 NOTE — Discharge Instructions (Signed)
Continue with your Claritin or Zyrtec for the allergies.  Take once a day throughout the allergy season Use the albuterol inhaler as needed for wheezing Follow-up with your primary care doctor

## 2018-05-21 NOTE — ED Provider Notes (Signed)
[facility]    CSN: [id number] Arrival date & time: 05/21/18  1747     History   Chief Complaint Chief Complaint  Patient presents with  . Asthma    HPI Philip Ramos is a 23 y.o. male.   HPI  Patient has known asthma.  He is usually treated with albuterol inhaler.  He has not seen his primary care doctor for some time, and believes that they would not refill his medication.  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  He does have seasonal allergies.  These are bothering him as well.  He went to a pharmacy in his lunch hour and got some Claritin.  He feels like this is helped a little bit.  He is here mostly to get an albuterol treatment/refill of his albuterol inhaler No fever or chills.  No significant cough.  No known exposure to illness or COVID-19  Past Medical History:  Diagnosis Date  . Asthma     There are no active problems to display for this patient.   History reviewed. No pertinent surgical history.     Home Medications    Prior to Admission medications   Medication Sig Start Date End Date Taking? Authorizing Provider  acetaminophen (TYLENOL) 500 MG tablet Take 500 mg by mouth daily as needed for mild pain.    [provider]  albuterol (PROVENTIL HFA;VENTOLIN HFA) 108 (90 Base) MCG/ACT inhaler Inhale 1-2 puffs into the lungs every 6 (six) hours as needed for wheezing or shortness of breath. 05/07/18   Linwood Dibbles, MD    Family History Family History  Problem Relation Age of Onset  . Hypertension Other   . Diabetes Other   . Cancer Other   . Cancer Mother   . Diabetes Mother   . Cancer Father   . Diabetes Father     Social History Social History   Tobacco Use  . Smoking status: Current Every Day Smoker    Packs/day: 1.00    Types: Cigarettes, Cigars  . Smokeless tobacco: Never Used  . Tobacco comment: smokes black and milds  Substance Use Topics  . Alcohol use: Yes   Alcohol/week: 1.0 standard drinks    Types: 1 Cans of beer per week    Comment: occ  . Drug use: No     Allergies   Amoxicillin; Peanut-containing drug products; Penicillins; and Shellfish allergy   Review of Systems Review of Systems  Constitutional: Negative for chills and fever.  HENT: Positive for rhinorrhea and sneezing. Negative for ear pain and sore throat.   Eyes: Positive for itching. Negative for pain and visual disturbance.  Respiratory: Positive for shortness of breath. Negative for cough.   Cardiovascular: Negative for chest pain and palpitations.  Gastrointestinal: Negative for abdominal pain and vomiting.  Genitourinary: Negative for dysuria and hematuria.  Musculoskeletal: Negative for arthralgias and back pain.  Skin: Negative for color change and rash.  Neurological: Negative for seizures and syncope.  All other systems reviewed and are negative.    Physical Exam Triage Vital Signs ED Triage Vitals  Enc Vitals Group     BP 05/21/18 1804 132/84     Pulse Rate 05/21/18 1803 84     Resp 05/21/18 1803 16     Temp 05/21/18 1803 98.5 F (36.9 C)     Temp src --      SpO2 05/21/18 1803 100 %     Weight --  No data found.  Updated Vital Signs BP 132/84   Pulse 84   Temp 98.5 F (36.9 C)   Resp 16   SpO2 100%      Physical Exam Constitutional:      General: He is not in acute distress.    Appearance: He is well-developed and normal weight.  HENT:     Head: Normocephalic and atraumatic.     Right Ear: Tympanic membrane, ear canal and external ear normal.     Left Ear: Tympanic membrane, ear canal and external ear normal.     Nose: Rhinorrhea present.     Mouth/Throat:     Pharynx: No posterior oropharyngeal erythema.  Eyes:     Conjunctiva/sclera: Conjunctivae normal.     Pupils: Pupils are equal, round, and reactive to light.  Neck:     Musculoskeletal: Normal range of motion.  Cardiovascular:     Rate and Rhythm: Normal rate and regular  rhythm.     Heart sounds: Normal heart sounds.  Pulmonary:     Effort: Pulmonary effort is normal. No respiratory distress.     Breath sounds: Wheezing present.     Comments: Scattered wheezes on inspiration, both lungs Abdominal:     General: There is no distension.     Palpations: Abdomen is soft.  Musculoskeletal: Normal range of motion.  Lymphadenopathy:     Cervical: Cervical adenopathy present.  Skin:    General: Skin is warm and dry.  Neurological:     Mental Status: He is alert.  Psychiatric:        Mood and Affect: Mood normal.        Behavior: Behavior normal.      UC Treatments / Results  Labs (all labs ordered are listed, but only abnormal results are displayed) Labs Reviewed - No data to display  EKG None  Radiology No results found.  Procedures Procedures (including critical care time)  Medications Ordered in UC Medications  albuterol (PROVENTIL HFA;VENTOLIN HFA) 108 (90 Base) MCG/ACT inhaler 2 puff (2 puffs Inhalation Given 05/21/18 1844)    Initial Impression / Assessment and Plan / UC Course  I have reviewed the triage vital signs and the nursing notes.  Pertinent labs & imaging results that were available during my care of the patient were reviewed by me and considered in my medical decision making (see chart for details).     Expressed importance of reestablishing with the PCP. Final Clinical Impressions(s) / UC Diagnoses   Final diagnoses:  Mild intermittent asthma with exacerbation     Discharge Instructions     Continue with your Claritin or Zyrtec for the allergies.  Take once a day throughout the allergy season Use the albuterol inhaler as needed for wheezing Follow-up with your primary care doctor    ED Prescriptions    None     Controlled Substance Prescriptions [place] Controlled Substance Registry consulted? Not Applicable   Eustace Moore, MD 05/21/18 [phone]

## 2018-05-21 NOTE — ED Triage Notes (Signed)
Pt c/o asthma flair up, states he ran out of his inhaler.

## 2018-10-14 ENCOUNTER — Other Ambulatory Visit: Payer: Self-pay

## 2018-10-14 ENCOUNTER — Other Ambulatory Visit: Payer: Self-pay | Admitting: Occupational Medicine

## 2018-10-14 ENCOUNTER — Ambulatory Visit: Payer: Self-pay

## 2018-10-14 DIAGNOSIS — M79641 Pain in right hand: Secondary | ICD-10-CM

## 2018-10-14 IMAGING — DX RIGHT HAND - COMPLETE 3+ VIEW
3 series · 3 of 3 positions shown · non-contrast
Comparison: [DATE].

CLINICAL DATA: Crush injury.

EXAM:
RIGHT HAND - COMPLETE 3+ VIEW

[hand pa]
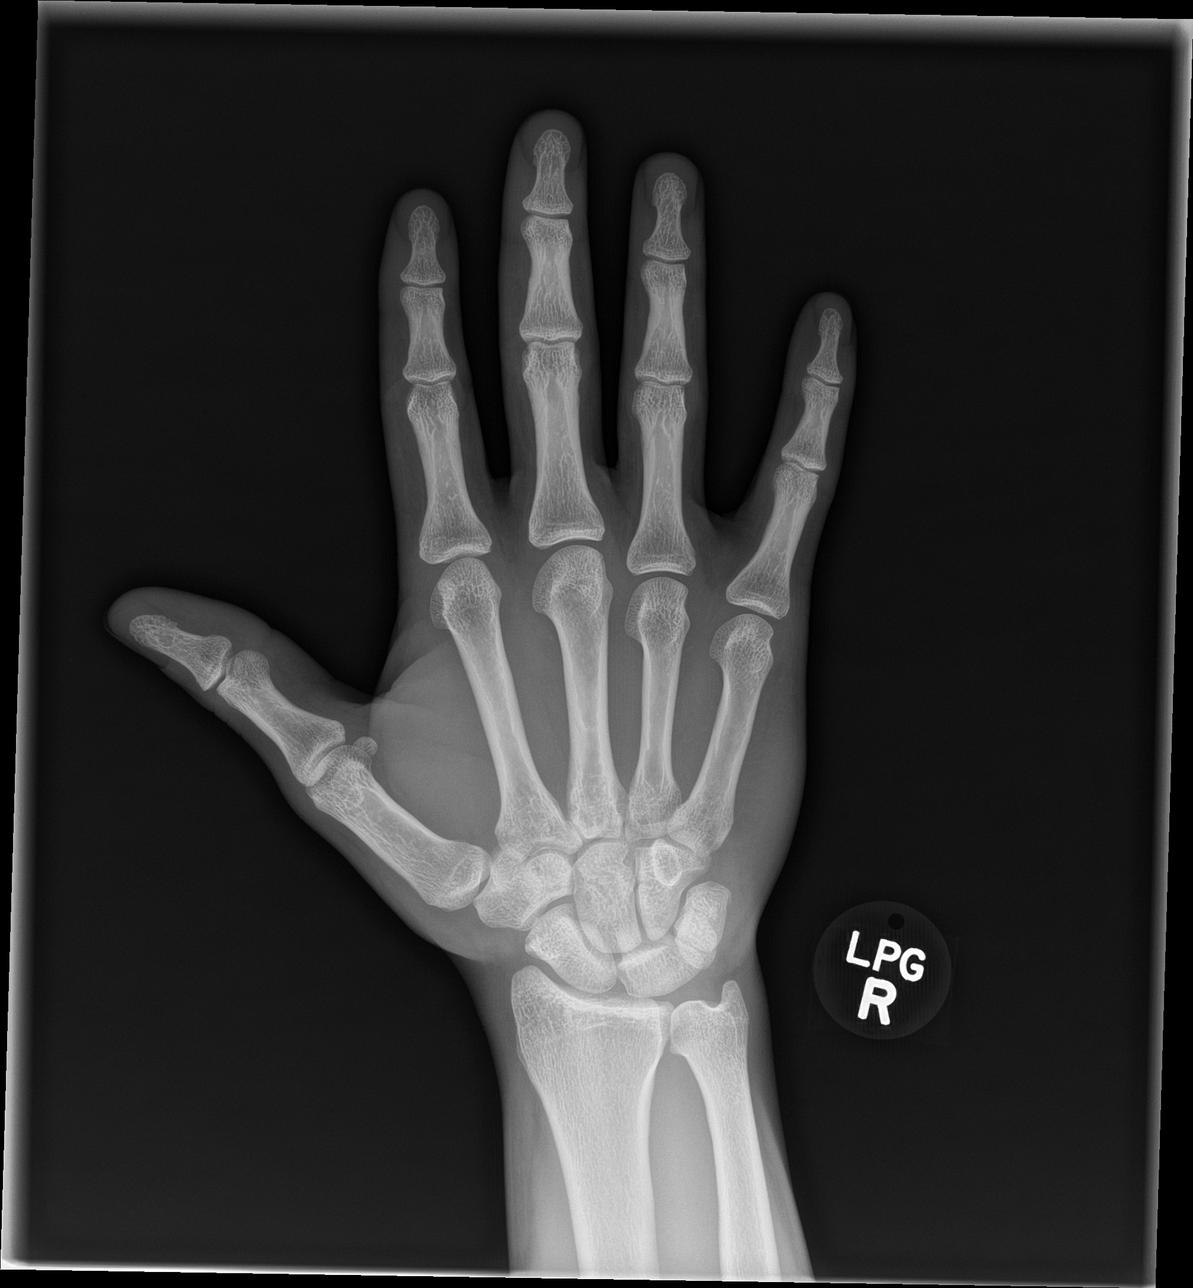

[hand obl]
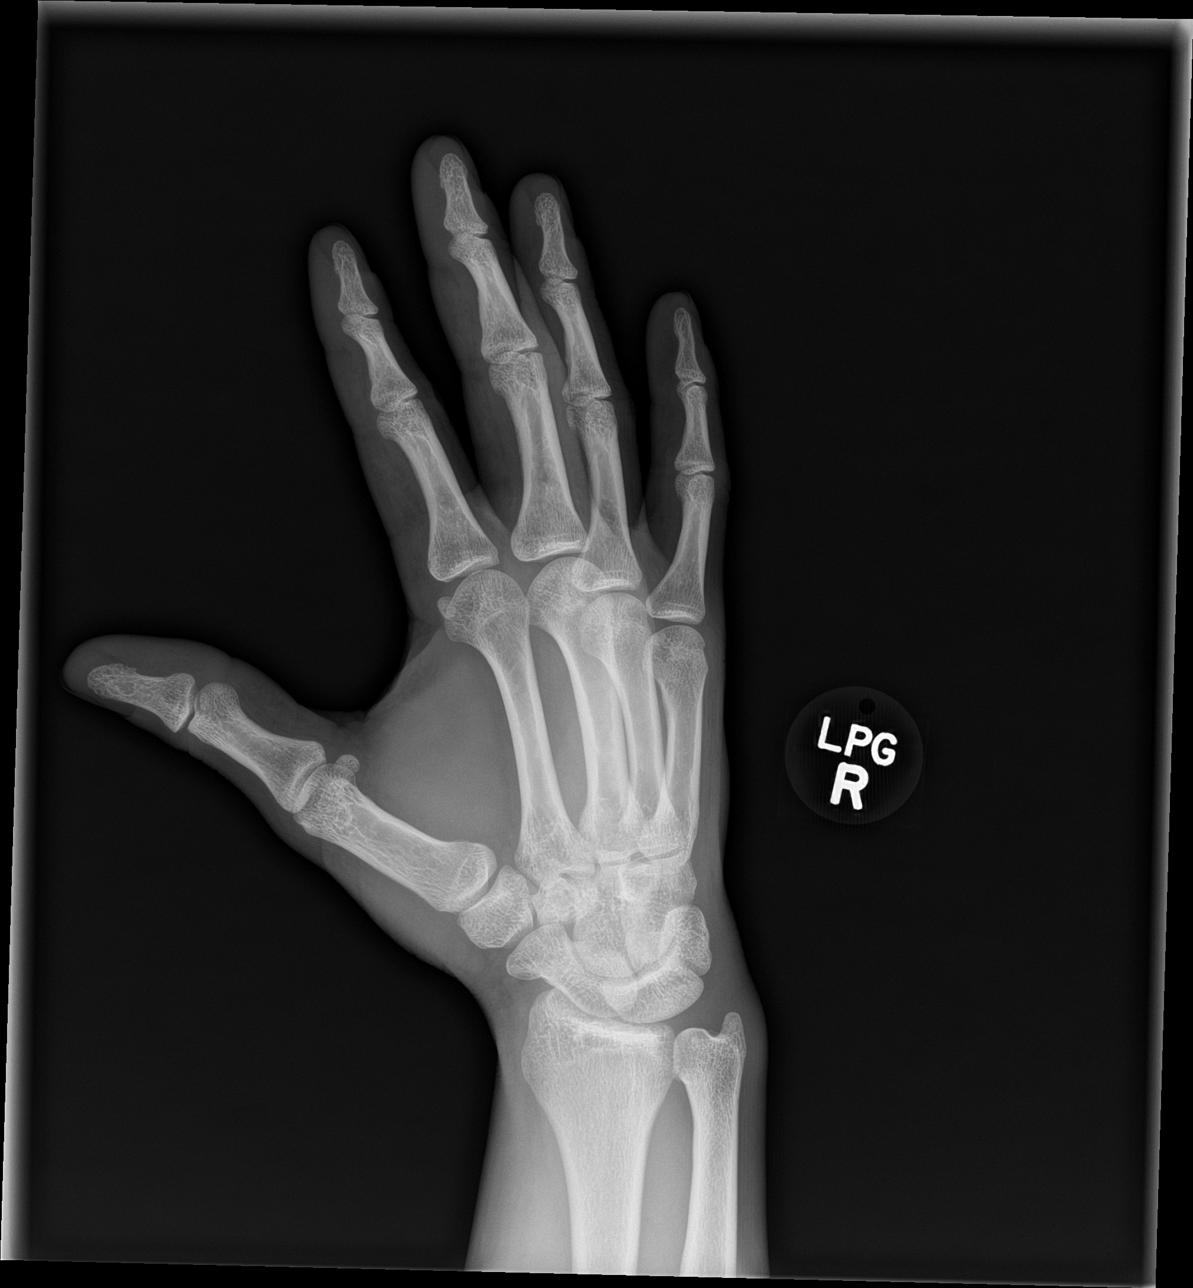

[hand lat]
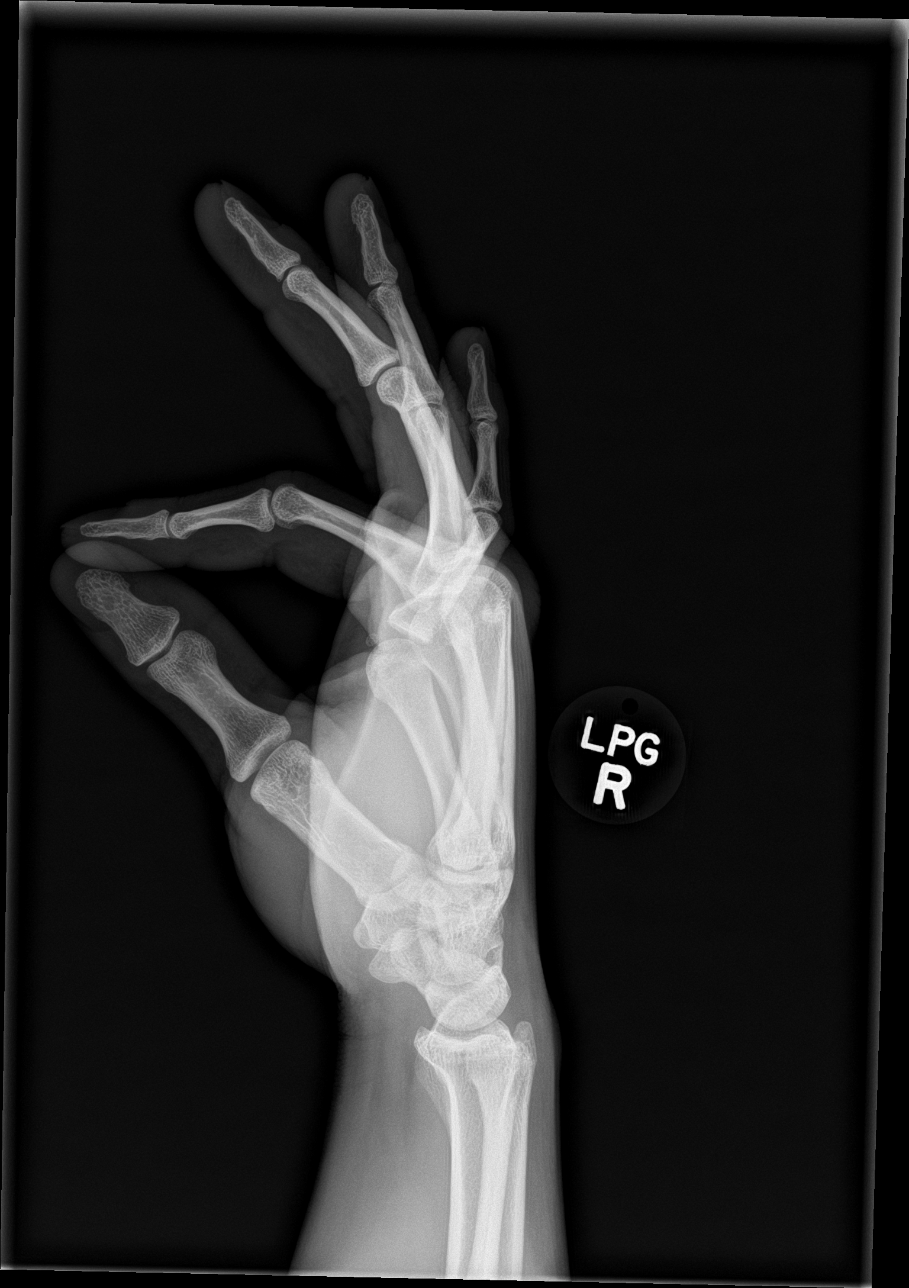

[3 of 3 positions shown; findings below may reference images not displayed]

FINDINGS: No acute bony or joint abnormality.  No radiopaque foreign body.
IMPRESSION: No acute bony abnormality.

## 2018-12-17 ENCOUNTER — Encounter (HOSPITAL_COMMUNITY): Payer: Self-pay

## 2018-12-17 ENCOUNTER — Other Ambulatory Visit: Payer: Self-pay

## 2018-12-17 ENCOUNTER — Ambulatory Visit (HOSPITAL_COMMUNITY)
Admission: EM | Admit: 2018-12-17 | Discharge: 2018-12-17 | Disposition: A | Discharge status: Home / Self Care | Payer: Medicare Other | Attending: Family Medicine | Admitting: Family Medicine

## 2018-12-17 DIAGNOSIS — M5410 Radiculopathy, site unspecified: Secondary | ICD-10-CM

## 2018-12-17 DIAGNOSIS — M79601 Pain in right arm: Secondary | ICD-10-CM

## 2018-12-17 MED ORDER — PREDNISONE 10 MG (21) PO TBPK: ORAL_TABLET | ORAL | 0 refills | Status: DC

## 2018-12-17 NOTE — ED Triage Notes (Signed)
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

## 2018-12-17 NOTE — Discharge Instructions (Addendum)
Take the prednisone as prescribed Gentle stretching, heat/ice, massage Follow up as needed for continued or worsening symptoms

## 2018-12-17 NOTE — ED Provider Notes (Signed)
[facility]    CSN: [id number] Arrival date & time: 12/17/18  1254      History   Chief Complaint Chief Complaint  Patient presents with  . Arm Pain    HPI Philip Ramos is a 23 y.o. male.   Patient is a 23 year old male who presents today with approximate 2 days of right upper arm discomfort, upper back discomfort with radiation down the right arm and some associated numbness and tingling.  Symptoms have been constant, waxing waning.  Certain movements makes the pain worse and resting makes it better.  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  Denies any specific injuries or falls.  No neck pain.  He has not taken any medication to treat his symptoms.  No chest pain or shortness of breath.  ROS per HPI    Arm Pain    Past Medical History:  Diagnosis Date  . Asthma     There are no active problems to display for this patient.   History reviewed. No pertinent surgical history.     Home Medications    Prior to Admission medications   Medication Sig Start Date End Date Taking? Authorizing Provider  acetaminophen (TYLENOL) 500 MG tablet Take 500 mg by mouth daily as needed for mild pain.    [provider]  albuterol (PROVENTIL HFA;VENTOLIN HFA) 108 (90 Base) MCG/ACT inhaler Inhale 1-2 puffs into the lungs every 6 (six) hours as needed for wheezing or shortness of breath. 05/21/18   Eustace Moore, MD  predniSONE (STERAPRED UNI-PAK 21 TAB) 10 MG (21) TBPK tablet 6 tabs for 1 day, then 5 tabs for 1 das, then 4 tabs for 1 day, then 3 tabs for 1 day, 2 tabs for 1 day, then 1 tab for 1 day 12/17/18   Janace Aris, NP    Family History Family History  Problem Relation Age of Onset  . Hypertension Other   . Diabetes Other   . Cancer Other   . Cancer Mother   . Diabetes Mother   . Cancer Father   . Diabetes Father     Social History Social History   Tobacco Use  . Smoking status: Current Every Day Smoker   Packs/day: 1.00    Types: Cigarettes, Cigars  . Smokeless tobacco: Never Used  . Tobacco comment: smokes black and milds  Substance Use Topics  . Alcohol use: Yes    Alcohol/week: 1.0 standard drinks    Types: 1 Cans of beer per week    Comment: occ  . Drug use: No     Allergies   Amoxicillin, Peanut-containing drug products, Penicillins, and Shellfish allergy   Review of Systems Review of Systems   Physical Exam Triage Vital Signs ED Triage Vitals  Enc Vitals Group     BP 12/17/18 1316 106/70     Pulse Rate 12/17/18 1316 66     Resp 12/17/18 1316 18     Temp 12/17/18 1316 97.8 F (36.6 C)     Temp Source 12/17/18 1316 Oral     SpO2 12/17/18 1316 100 %     Weight 12/17/18 1317 160 lb (72.6 kg)     Height --      Head Circumference --      Peak Flow --      Pain Score 12/17/18 1317 4     Pain Loc --      Pain Edu? --  Excl. in GC? --    No data found.  Updated Vital Signs BP 106/70 (BP Location: Right Arm)   Pulse 66   Temp 97.8 F (36.6 C) (Oral)   Resp 18   Wt 160 lb (72.6 kg)   SpO2 100%   BMI 22.96 kg/m   Visual Acuity Right Eye Distance:   Left Eye Distance:   Bilateral Distance:    Right Eye Near:   Left Eye Near:    Bilateral Near:     Physical Exam Vitals signs and nursing note reviewed.  Constitutional:      Appearance: Normal appearance.  HENT:     Head: Normocephalic and atraumatic.     Nose: Nose normal.  Eyes:     Conjunctiva/sclera: Conjunctivae normal.  Neck:     Musculoskeletal: Normal range of motion.  Pulmonary:     Effort: Pulmonary effort is normal.  Musculoskeletal: Normal range of motion.     Right shoulder: He exhibits normal range of motion, no tenderness, no bony tenderness, no swelling, no effusion, no crepitus, no deformity, no laceration, no pain, no spasm, normal pulse and normal strength.     Right elbow: He exhibits normal range of motion, no swelling, no effusion, no deformity and no laceration. No  tenderness found. No radial head, no medial epicondyle, no lateral epicondyle and no olecranon process tenderness noted.       Arms:  Skin:    General: Skin is warm and dry.  Neurological:     Mental Status: He is alert.  Psychiatric:        Mood and Affect: Mood normal.      UC Treatments / Results  Labs (all labs ordered are listed, but only abnormal results are displayed) Labs Reviewed - No data to display  EKG   Radiology No results found.  Procedures Procedures (including critical care time)  Medications Ordered in UC Medications - No data to display  Initial Impression / Assessment and Plan / UC Course  I have reviewed the triage vital signs and the nursing notes.  Pertinent labs & imaging results that were available during my care of the patient were reviewed by me and considered in my medical decision making (see chart for details).     Right arm pain with radiculopathy- could be rotator cuff issue versus muscle strain with nerve inflammation  Will treat with prednisone daily for 6 days and have him rest, elevate heat/ice, gentle stretching and massage.  Follow up as needed for continued or worsening symptoms   Final Clinical Impressions(s) / UC Diagnoses   Final diagnoses:  Right arm pain  Radiculopathy, unspecified spinal region     Discharge Instructions     Take the prednisone as prescribed Gentle stretching, heat/ice, massage Follow up as needed for continued or worsening symptoms     ED Prescriptions    Medication Sig Dispense Auth. Provider   predniSONE (STERAPRED UNI-PAK 21 TAB) 10 MG (21) TBPK tablet 6 tabs for 1 day, then 5 tabs for 1 das, then 4 tabs for 1 day, then 3 tabs for 1 day, 2 tabs for 1 day, then 1 tab for 1 day 21 tablet Bast, Traci A, NP     PDMP not reviewed this encounter.   Orvan July, NP 12/17/18 1422

## 2019-05-29 ENCOUNTER — Emergency Department (HOSPITAL_COMMUNITY): Payer: Medicare Other

## 2019-05-29 ENCOUNTER — Emergency Department (HOSPITAL_COMMUNITY)
Admission: EM | Admit: 2019-05-29 | Discharge: 2019-05-29 | Disposition: A | Discharge status: Home / Self Care | Payer: Medicare Other | Attending: Emergency Medicine | Admitting: Emergency Medicine

## 2019-05-29 ENCOUNTER — Encounter (HOSPITAL_COMMUNITY): Payer: Self-pay | Admitting: Emergency Medicine

## 2019-05-29 ENCOUNTER — Other Ambulatory Visit: Payer: Self-pay

## 2019-05-29 DIAGNOSIS — Z79899 Other long term (current) drug therapy: Secondary | ICD-10-CM | POA: Insufficient documentation

## 2019-05-29 DIAGNOSIS — J4521 Mild intermittent asthma with (acute) exacerbation: Secondary | ICD-10-CM | POA: Insufficient documentation

## 2019-05-29 DIAGNOSIS — F17210 Nicotine dependence, cigarettes, uncomplicated: Secondary | ICD-10-CM | POA: Diagnosis not present

## 2019-05-29 DIAGNOSIS — R6884 Jaw pain: Secondary | ICD-10-CM | POA: Insufficient documentation

## 2019-05-29 DIAGNOSIS — R0602 Shortness of breath: Secondary | ICD-10-CM | POA: Diagnosis present

## 2019-05-29 LAB — CBC
HCT: 49.6 % (ref 39.0–52.0)
Hemoglobin: 16.8 g/dL (ref 13.0–17.0)
MCH: 29.3 pg (ref 26.0–34.0)
MCHC: 33.9 g/dL (ref 30.0–36.0)
MCV: 86.6 fL (ref 80.0–100.0)
Platelets: 147 10*3/uL — ABNORMAL LOW (ref 150–400)
RBC: 5.73 MIL/uL (ref 4.22–5.81)
RDW: 13.2 % (ref 11.5–15.5)
WBC: 5 10*3/uL (ref 4.0–10.5)
nRBC: 0 % (ref 0.0–0.2)

## 2019-05-29 LAB — BASIC METABOLIC PANEL
Anion gap: 4 — ABNORMAL LOW (ref 5–15)
BUN: 28 mg/dL — ABNORMAL HIGH (ref 6–20)
CO2: 26 mmol/L (ref 22–32)
Calcium: 9.2 mg/dL (ref 8.9–10.3)
Chloride: 110 mmol/L (ref 98–111)
Creatinine, Ser: 1.23 mg/dL (ref 0.61–1.24)
GFR calc Af Amer: >60 mL/min (ref >60)
GFR calc non Af Amer: >60 mL/min (ref >60)
Glucose, Bld: 102 mg/dL — ABNORMAL HIGH (ref 70–99)
Potassium: 4.1 mmol/L (ref 3.5–5.1)
Sodium: 140 mmol/L (ref 135–145)

## 2019-05-29 LAB — TROPONIN I (HIGH SENSITIVITY)
Troponin I (High Sensitivity): 3 ng/L (ref <18)
Troponin I (High Sensitivity): 3 ng/L (ref <18)

## 2019-05-29 LAB — D-DIMER, QUANTITATIVE: D-Dimer, Quant: <0.27 ug/mL-FEU (ref 0.00–0.50)

## 2019-05-29 IMAGING — CR DG CHEST 2V
2 series · 2 of 2 positions shown · non-contrast
Comparison: Chest x-ray [DATE]

CLINICAL DATA: Mid chest pain and shortness of breath since this
morning.

EXAM:
CHEST - 2 VIEW

[w chest pa]
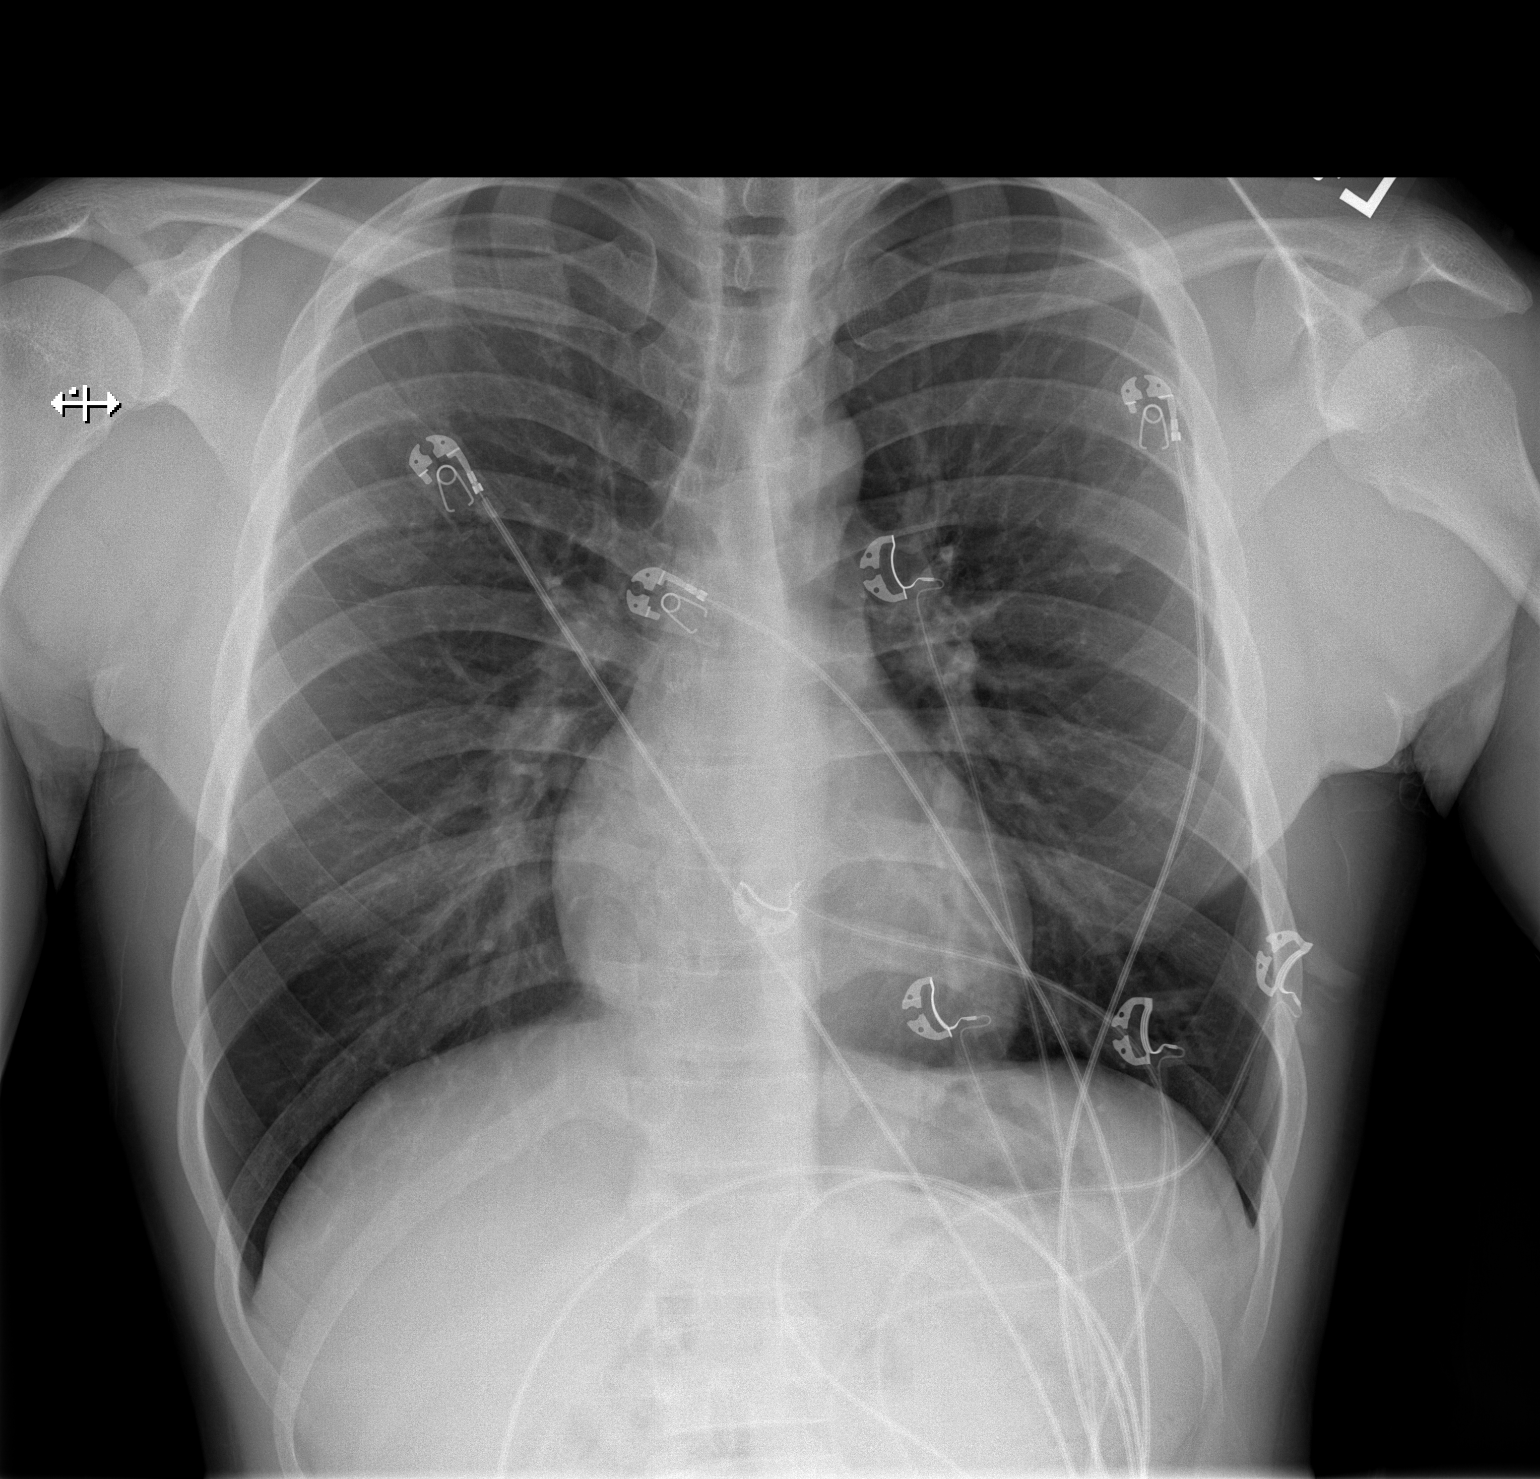

[w chest lat]
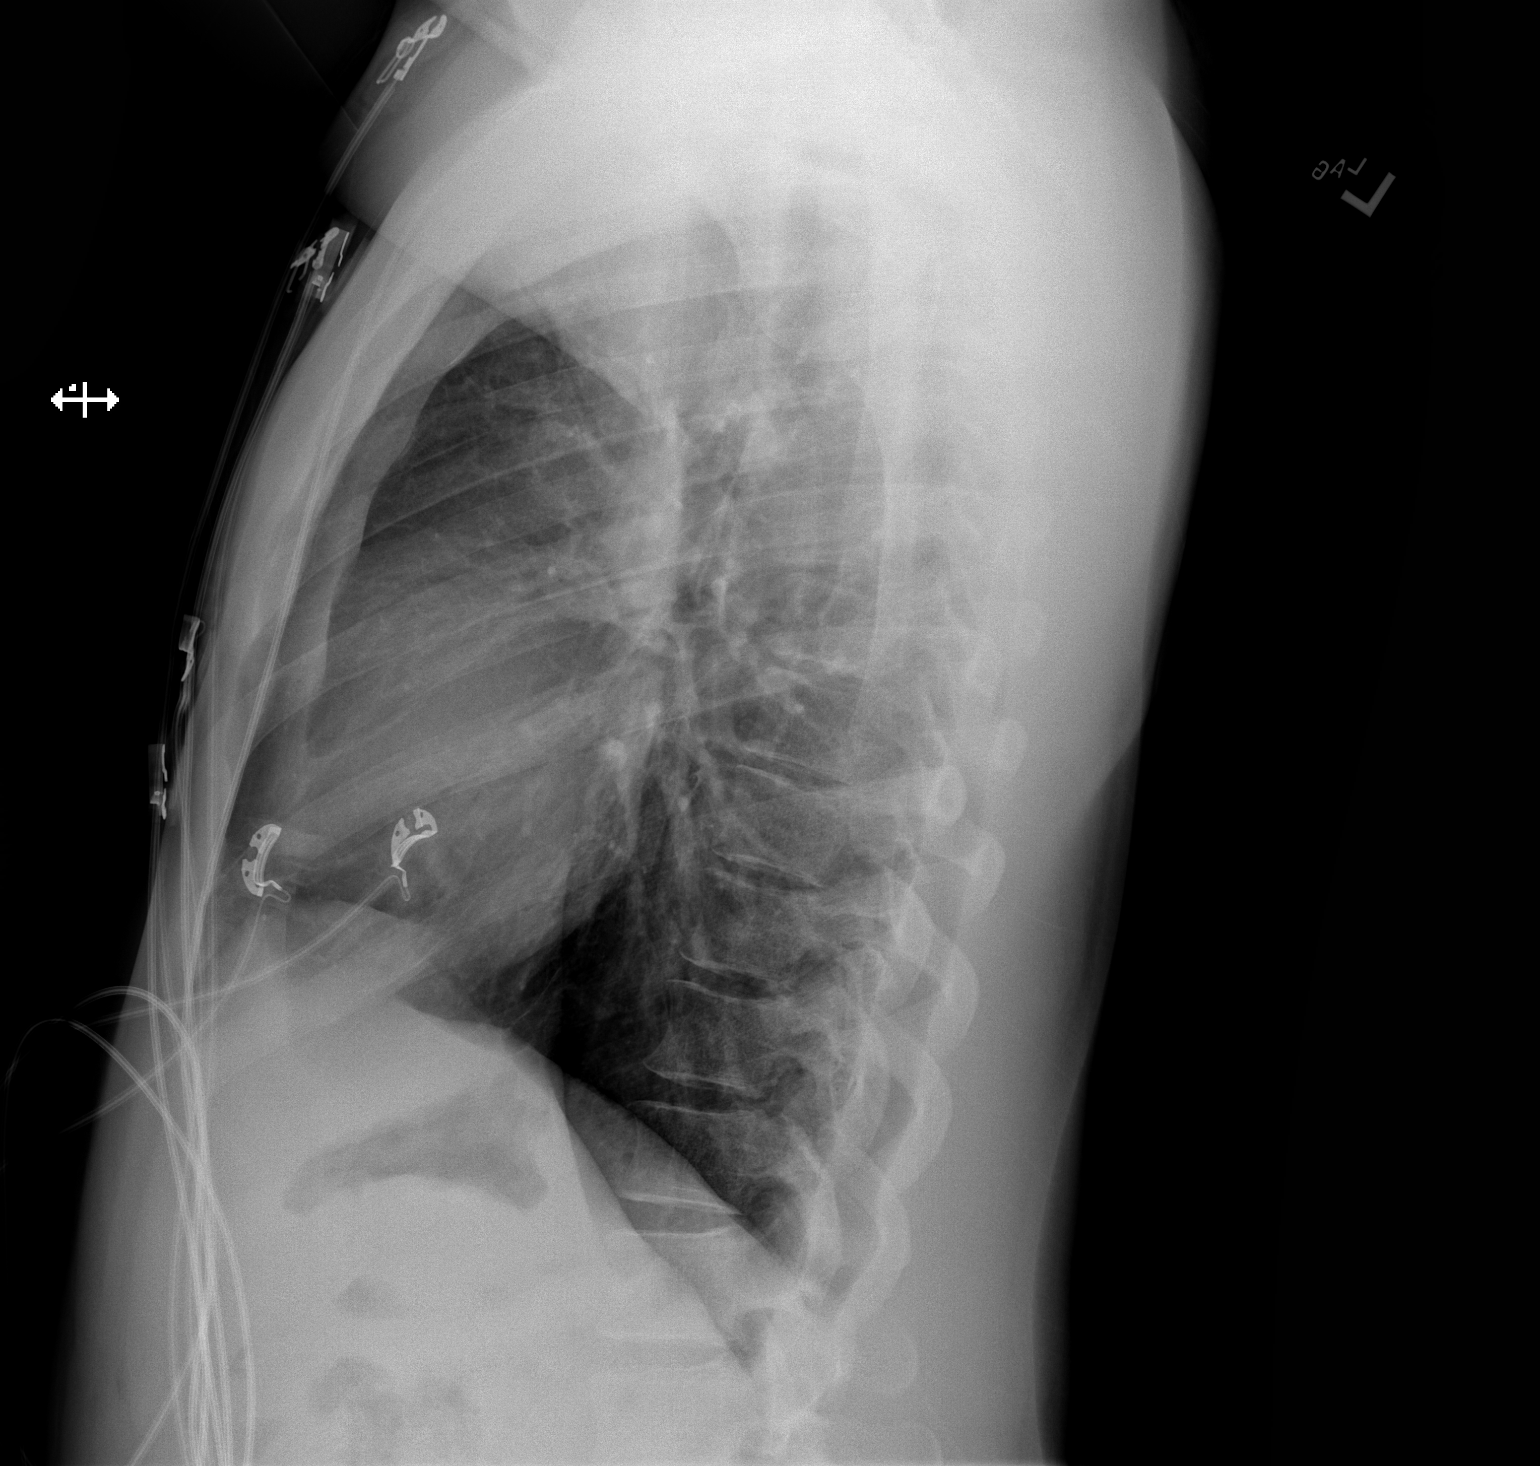

[2 of 2 positions shown; findings below may reference images not displayed]

FINDINGS: The cardiac silhouette, mediastinal and hilar contours are within
normal limits and stable. The lungs are clear. No pleural effusion
or pneumothorax. The bony thorax is intact.
IMPRESSION: No acute cardiopulmonary findings.

## 2019-05-29 MED ORDER — ALBUTEROL SULFATE HFA 108 (90 BASE) MCG/ACT IN AERS
2.0000 | INHALATION_SPRAY | Freq: Once | RESPIRATORY_TRACT | Status: AC
  Administered 2019-05-29: 2 via RESPIRATORY_TRACT
  Filled 2019-05-29: qty 6.7

## 2019-05-29 MED ORDER — PREDNISONE 20 MG PO TABS: ORAL_TABLET | ORAL | 0 refills | Status: DC

## 2019-05-29 MED ORDER — NITROGLYCERIN 0.4 MG SL SUBL: 0.4000 mg | SUBLINGUAL_TABLET | SUBLINGUAL | Status: DC | PRN

## 2019-05-29 MED ORDER — FLUTICASONE PROPIONATE 50 MCG/ACT NA SUSP: 2.0000 | Freq: Every day | NASAL | 0 refills | Status: DC

## 2019-05-29 MED ORDER — ASPIRIN 81 MG PO CHEW
324.0000 mg | CHEWABLE_TABLET | Freq: Once | ORAL | Status: AC
  Administered 2019-05-29: 324 mg via ORAL
  Filled 2019-05-29: qty 4

## 2019-05-29 NOTE — ED Provider Notes (Signed)
[facility] DEPT Provider Note   CSN: [id number] Arrival date & time: 05/29/19  0548     History Chief Complaint  Patient presents with  . Shortness of Breath  . Jaw Pain    Philip Ramos is a 24 y.o. male.  The history is provided by the patient. No language interpreter was used.  Shortness of Breath    24 year old male with history of asthma presenting for evaluation of shortness of breath.  Patient states yesterday he felt little bit tired.  This morning upon getting ready to go to walk, he endorsed having increased shortness of breath, tightness in his chest and described as "elephant sitting on my chest, radiates to his back, and also complaining of some pain to the right side of his jaw and right arm which is new.  He described the pain as almost a pinching nerve sensation.  States pain is mild but presents.  No associated lightheadedness or dizziness no nausea or vomiting no diaphoresis.  Admits to alcohol and tobacco use but denies any recreational drug use such as cocaine or marijuana.  Does endorse family history of cardiac disease.  Denies any prior history of PE or DVT, no recent surgery or prolonged bedrest or active cancer hemoptysis.  States that he has history of asthma but does not have any inhaler.  States chest tightness reminds him of an asthma exacerbation.  Endorse occasional sneeze when he is exposed to pollen.  Denies any specific treatment tried.  Past Medical History:  Diagnosis Date  . Asthma     There are no problems to display for this patient.   History reviewed. No pertinent surgical history.     Family History  Problem Relation Age of Onset  . Hypertension Other   . Diabetes Other   . Cancer Other   . Cancer Mother   . Diabetes Mother   . Cancer Father   . Diabetes Father     Social History   Tobacco Use  . Smoking status: Current Every Day Smoker    Packs/day: 1.00    Types: Cigarettes, Cigars  .  Smokeless tobacco: Never Used  . Tobacco comment: smokes black and milds  Substance Use Topics  . Alcohol use: Yes    Alcohol/week: 1.0 standard drinks    Types: 1 Cans of beer per week    Comment: occ  . Drug use: No    Home Medications Prior to Admission medications   Medication Sig Start Date End Date Taking? Authorizing Provider  acetaminophen (TYLENOL) 500 MG tablet Take 500 mg by mouth daily as needed for mild pain.    [provider]  albuterol (PROVENTIL HFA;VENTOLIN HFA) 108 (90 Base) MCG/ACT inhaler Inhale 1-2 puffs into the lungs every 6 (six) hours as needed for wheezing or shortness of breath. 05/21/18   Raylene Everts, MD  predniSONE (STERAPRED UNI-PAK 21 TAB) 10 MG (21) TBPK tablet 6 tabs for 1 day, then 5 tabs for 1 das, then 4 tabs for 1 day, then 3 tabs for 1 day, 2 tabs for 1 day, then 1 tab for 1 day 12/17/18   Loura Halt A, NP    Allergies    Amoxicillin, Peanut-containing drug products, Penicillins, and Shellfish allergy  Review of Systems   Review of Systems  Respiratory: Positive for shortness of breath.   All other systems reviewed and are negative.   Physical Exam Updated Vital Signs BP 127/74 (BP Location: Left Arm)  Pulse (!) 56   Temp 98.1 F (36.7 C) (Oral)   Resp 15   Ht 5\' 10"  (1.778 m)   Wt 77.1 kg   SpO2 96%   BMI 24.39 kg/m   Physical Exam Vitals and nursing note reviewed.  Constitutional:      General: He is not in acute distress.    Appearance: He is well-developed.     Comments: -Patient resting comfortably in bed in no acute discomfort.  HENT:     Head: Atraumatic.  Eyes:     Conjunctiva/sclera: Conjunctivae normal.  Cardiovascular:     Rate and Rhythm: Bradycardia present.  Pulmonary:     Effort: Pulmonary effort is normal.     Breath sounds: Wheezing (Very faint expiratory wheezes heard.) present. No decreased breath sounds, rhonchi or rales.  Chest:     Chest wall: No tenderness.  Musculoskeletal:      Cervical back: Neck supple.     Comments: 5 out of 5 strength all 4 extremities.  Intact radial pulse bilaterally.  Skin:    Findings: No rash.  Neurological:     Mental Status: He is alert and oriented to person, place, and time.  Psychiatric:        Mood and Affect: Mood normal.     ED Results / Procedures / Treatments   Labs (all labs ordered are listed, but only abnormal results are displayed) Labs Reviewed  BASIC METABOLIC PANEL - Abnormal; Notable for the following components:      Result Value   Glucose, Bld 102 (*)    BUN 28 (*)    Anion gap 4 (*)    All other components within normal limits  CBC - Abnormal; Notable for the following components:   Platelets 147 (*)    All other components within normal limits  D-DIMER, QUANTITATIVE (NOT AT [facility])  TROPONIN I (HIGH SENSITIVITY)  TROPONIN I (HIGH SENSITIVITY)    EKG EKG Interpretation  Date/Time:  Thursday May 29 2019 06:03:01 EDT Ventricular Rate:  56 PR Interval:    QRS Duration: 109 QT Interval:  412 QTC Calculation: 398 R Axis:   78 Text Interpretation: Sinus rhythm Incomplete left bundle branch block Borderline ST elevation, anterior leads Nonspecific ST and T wave abnormality unchanged from prior. Confirmed by 11-04-1993 [phone]) on 05/29/2019 8:31:50 AM   ED ECG REPORT   Date: 05/29/2019  Rate: 56  Rhythm: sinus bradycardia  QRS Axis: normal  Intervals: normal  ST/T Wave abnormalities: ST elevations laterally and acute myocardial infarction  Conduction Disutrbances:incomplete LBBB  Narrative Interpretation:   Old EKG Reviewed: unchanged  I have personally reviewed the EKG tracing and agree with the computerized printout as noted.   Radiology DG Chest 2 View  Result Date: 05/29/2019 CLINICAL DATA:  Mid chest pain and shortness of breath since this morning. EXAM: CHEST - 2 VIEW COMPARISON:  Chest x-ray 07/07/2018 FINDINGS: The cardiac silhouette, mediastinal and hilar contours are within  normal limits and stable. The lungs are clear. No pleural effusion or pneumothorax. The bony thorax is intact. IMPRESSION: No acute cardiopulmonary findings. Electronically Signed   By: 07/09/2018 M.D.   On: 05/29/2019 06:29    Procedures Procedures (including critical care time)  Medications Ordered in ED Medications  nitroGLYCERIN (NITROSTAT) SL tablet 0.4 mg (has no administration in time range)  aspirin chewable tablet 324 mg (324 mg Oral Given 05/29/19 0830)  albuterol (VENTOLIN HFA) 108 (90 Base) MCG/ACT inhaler 2 puff (2 puffs Inhalation Given  05/29/19 6160)    ED Course  I have reviewed the triage vital signs and the nursing notes.  Pertinent labs & imaging results that were available during my care of the patient were reviewed by me and considered in my medical decision making (see chart for details).    MDM Rules/Calculators/A&P                      BP 127/74 (BP Location: Left Arm)   Pulse (!) 56   Temp 98.1 F (36.7 C) (Oral)   Resp 15   Ht 5\' 10"  (1.778 m)   Wt 77.1 kg   SpO2 96%   BMI 24.39 kg/m   Final Clinical Impression(s) / ED Diagnoses Final diagnoses:  Mild intermittent asthma with exacerbation    Rx / DC Orders ED Discharge Orders         Ordered    predniSONE (DELTASONE) 20 MG tablet     05/29/19 1206    fluticasone (FLONASE) 50 MCG/ACT nasal spray  Daily     05/29/19 1206         8:20 AM Patient here with complaints of chest pressure sensation, shortness of breath, as well as right arm and jaw pain for the past 2 hours.  Pain is mild.  Initial EKG shows ST elevation in the lateral lead and anterior leads was read as acute MI.  However EKG appears similar to prior.  CAre discussed with Dr. 07/29/19, who agrees it's unchanged.  HEART score of 3, low risk of MACE.  Will obtain d-dimer  10:47 AM Negative d-dimer, doubt PE.  Initial trop within normal limit, awaits delta trop.  PT given albuterol inhaler, anticipate d/c with steroid as well for  suspect asthma exacerbation.    12:04 PM Normal delta trop.  Stable for discharge. outpt f/u recommended.  Return precaution discussed.    Rubin Payor, PA-C 05/29/19 1206    07/29/19, MD 05/29/19 [phone]

## 2019-05-29 NOTE — ED Notes (Signed)
Patient ambulatory to bathroom with no SOB at this time.

## 2019-05-29 NOTE — ED Triage Notes (Addendum)
Patient complaining of sob and right jaw pain that is radiating down to his neck. Patient states it started today. Patient has a hx of asthma.

## 2019-05-29 NOTE — ED Notes (Signed)
Discharge paperwork reviewed with pt, including prescriptions.  Pt verbalized understanding, ambulatory at time of discharge. On room air.

## 2020-03-02 ENCOUNTER — Ambulatory Visit (HOSPITAL_COMMUNITY)
Admission: EM | Admit: 2020-03-02 | Discharge: 2020-03-02 | Disposition: A | Discharge status: Home / Self Care | Payer: Medicare Other | Attending: Student | Admitting: Student

## 2020-03-02 ENCOUNTER — Other Ambulatory Visit: Payer: Self-pay

## 2020-03-02 ENCOUNTER — Encounter (HOSPITAL_COMMUNITY): Payer: Self-pay

## 2020-03-02 DIAGNOSIS — J069 Acute upper respiratory infection, unspecified: Secondary | ICD-10-CM | POA: Diagnosis not present

## 2020-03-02 DIAGNOSIS — Z20822 Contact with and (suspected) exposure to covid-19: Secondary | ICD-10-CM

## 2020-03-02 MED ORDER — DM-GUAIFENESIN ER 30-600 MG PO TB12: 1.0000 | ORAL_TABLET | Freq: Two times a day (BID) | ORAL | 0 refills | Status: DC

## 2020-03-02 MED ORDER — FLUTICASONE PROPIONATE 50 MCG/ACT NA SUSP: 1.0000 | Freq: Every day | NASAL | 2 refills | Status: DC

## 2020-03-02 NOTE — ED Triage Notes (Signed)
Pt states he was exposed to friends with COVID this past week and c/o HA, right ear pain, onset a couple days ago.  Denies runny nose, sore throat, cough, fever, n/v/d.

## 2020-03-02 NOTE — ED Provider Notes (Signed)
[facility]    CSN: [id number] Arrival date & time: 03/02/20  1422      History   Chief Complaint Chief Complaint  Patient presents with  . Headache  . covid exposure  . Otalgia    HPI Philip Ramos is a 25 y.o. male Presenting for URI symptoms for 2 days following covid exposure. History of asthma. Presenting today with headache, right ear pain. Denies hearing changes, ringing in ear, dizzines.  Denies fevers/chills, n/v/d, shortness of breath, chest pain, cough, congestion, facial pain, teeth pain, sore throat, loss of taste/smell, swollen lymph nodes. Denies worst headache of life, thunderclap headache, weakness/sensation changes in arms/legs, vision changes, shortness of breath, chest pain/pressure, photophobia, phonophobia, n/v/d.  Denies chest pain, shortness of breath, confusion, high fevers.  Not vaccinated for covid-19.    HPI  Past Medical History:  Diagnosis Date  . Asthma     There are no problems to display for this patient.   History reviewed. No pertinent surgical history.     Home Medications    Prior to Admission medications   Medication Sig Start Date End Date Taking? Authorizing Provider  dextromethorphan-guaiFENesin (MUCINEX DM) 30-600 MG 12hr tablet Take 1 tablet by mouth 2 (two) times daily. 03/02/20  Yes Rhys Martini, PA-C  fluticasone (FLONASE) 50 MCG/ACT nasal spray Place 1 spray into both nostrils daily. 03/02/20  Yes Rhys Martini, PA-C  predniSONE (DELTASONE) 20 MG tablet 2 tabs po daily x 3 days 05/29/19   Fayrene Helper, PA-C  albuterol (PROVENTIL HFA;VENTOLIN HFA) 108 (90 Base) MCG/ACT inhaler Inhale 1-2 puffs into the lungs every 6 (six) hours as needed for wheezing or shortness of breath. Patient not taking: Reported on 05/29/2019 05/21/18 05/29/19  Eustace Moore, MD    Family History Family History  Problem Relation Age of Onset  . Hypertension Other   . Diabetes Other   . Cancer Other   . Cancer Mother   .  Diabetes Mother   . Cancer Father   . Diabetes Father     Social History Social History   Tobacco Use  . Smoking status: Current Every Day Smoker    Packs/day: 1.00    Types: Cigarettes, Cigars  . Smokeless tobacco: Never Used  . Tobacco comment: smokes black and milds  Vaping Use  . Vaping Use: Never used  Substance Use Topics  . Alcohol use: Yes    Alcohol/week: 1.0 standard drink    Types: 1 Cans of beer per week    Comment: occ  . Drug use: No     Allergies   Amoxicillin, Peanut-containing drug products, Penicillins, and Shellfish allergy   Review of Systems Review of Systems  Constitutional: Negative for appetite change, chills and fever.  HENT: Positive for ear pain. Negative for congestion, rhinorrhea, sinus pressure, sinus pain and sore throat.   Eyes: Negative for redness and visual disturbance.  Respiratory: Negative for cough, chest tightness, shortness of breath and wheezing.   Cardiovascular: Negative for chest pain and palpitations.  Gastrointestinal: Negative for abdominal pain, constipation, diarrhea, nausea and vomiting.  Genitourinary: Negative for dysuria, frequency and urgency.  Musculoskeletal: Negative for myalgias.  Neurological: Positive for headaches. Negative for dizziness and weakness.  Psychiatric/Behavioral: Negative for confusion.  All other systems reviewed and are negative.    Physical Exam Triage Vital Signs ED Triage Vitals  Enc Vitals Group     BP 03/02/20 1602 131/74     Pulse Rate 03/02/20 1602 78  Resp 03/02/20 1602 16     Temp 03/02/20 1602 98.2 F (36.8 C)     Temp Source 03/02/20 1602 Oral     SpO2 03/02/20 1602 100 %     Weight 03/02/20 1600 165 lb (74.8 kg)     Height 03/02/20 1600 5\' 10"  (1.778 m)     Head Circumference --      Peak Flow --      Pain Score 03/02/20 1600 0     Pain Loc --      Pain Edu? --      Excl. in GC? --    No data found.  Updated Vital Signs BP 131/74 (BP Location: Right Arm)    Pulse 78   Temp 98.2 F (36.8 C) (Oral)   Resp 16   Ht 5\' 10"  (1.778 m)   Wt 165 lb (74.8 kg)   SpO2 100%   BMI 23.68 kg/m   Visual Acuity Right Eye Distance:   Left Eye Distance:   Bilateral Distance:    Right Eye Near:   Left Eye Near:    Bilateral Near:     Physical Exam Vitals reviewed.  Constitutional:      General: He is not in acute distress.    Appearance: Normal appearance. He is not ill-appearing.  HENT:     Head: Normocephalic and atraumatic.     Right Ear: Hearing, tympanic membrane, ear canal and external ear normal. No swelling or tenderness. There is impacted cerumen (partialy occluded by cerumen). No mastoid tenderness. Tympanic membrane is not perforated, erythematous, retracted or bulging.     Left Ear: Hearing, tympanic membrane, ear canal and external ear normal. No swelling or tenderness. There is no impacted cerumen. No mastoid tenderness. Tympanic membrane is not perforated, erythematous, retracted or bulging.     Nose:     Right Sinus: No maxillary sinus tenderness or frontal sinus tenderness.     Left Sinus: No maxillary sinus tenderness or frontal sinus tenderness.     Mouth/Throat:     Mouth: Mucous membranes are moist.     Pharynx: Uvula midline. Posterior oropharyngeal erythema present. No oropharyngeal exudate.     Tonsils: No tonsillar exudate.  Cardiovascular:     Rate and Rhythm: Normal rate and regular rhythm.     Heart sounds: Normal heart sounds.  Pulmonary:     Breath sounds: Normal breath sounds and air entry. No wheezing, rhonchi or rales.  Chest:     Chest wall: No tenderness.  Abdominal:     General: Abdomen is flat. Bowel sounds are normal.     Tenderness: There is no abdominal tenderness. There is no guarding or rebound.  Lymphadenopathy:     Cervical: No cervical adenopathy.  Neurological:     General: No focal deficit present.     Mental Status: He is alert and oriented to person, place, and time.  Psychiatric:         Attention and Perception: Attention and perception normal.        Mood and Affect: Mood and affect normal.        Behavior: Behavior normal. Behavior is cooperative.        Thought Content: Thought content normal.        Judgment: Judgment normal.      UC Treatments / Results  Labs (all labs ordered are listed, but only abnormal results are displayed) Labs Reviewed  SARS CORONAVIRUS 2 (TAT 6-24 HRS)    EKG  Radiology No results found.  Procedures Procedures (including critical care time)  Medications Ordered in UC Medications - No data to display  Initial Impression / Assessment and Plan / UC Course  I have reviewed the triage vital signs and the nursing notes.  Pertinent labs & imaging results that were available during my care of the patient were reviewed by me and considered in my medical decision making (see chart for details).     Covid test sent today. Patient is not vaccinated for covid-19. Isolation precautions per CDC guidelines until negative result. Symptomatic relief with OTC Mucinex, Nyquil, etc. Return precautions- new/worsening fevers/chills, shortness of breath, chest pain, abd pain, etc.   mucinex DM and flonase for congestion.   Final Clinical Impressions(s) / UC Diagnoses   Final diagnoses:  Acute upper respiratory infection  Exposure to COVID-19 virus     Discharge Instructions     -Mucinex DM for congestion. You can take this in the daytime or nighttime.  -Flonase nasal steroid as needed for congestiona nd ear pressure.  We are currently awaiting result of your PCR covid-19 test. This typically comes back in 1-2 days. We'll call you if the result is positive. Otherwise, the result will be sent electronically to your MyChart. You can also call this clinic and ask for your result via telephone.   Please isolate at home while awaiting these results. If your test is positive for Covid-19, continue to isolate at home for 5 days if you have mild  symptoms, or a total of 10 days from symptom onset if you have more severe symptoms. If you quarantine for a shorter period of time (i.e. 5 days), make sure to wear a mask until day 10 of symptoms. Treat your symptoms at home with OTC remedies like tylenol/ibuprofen, mucinex, nyquil, etc. Seek medical attention if you develop high fevers, chest pain, shortness of breath, ear pain, facial pain, etc. Make sure to get up and move around every 2-3 hours while convalescing to help prevent blood clots. Drink plenty of fluids, and rest as much as possible.     ED Prescriptions    Medication Sig Dispense Auth. Provider   dextromethorphan-guaiFENesin (MUCINEX DM) 30-600 MG 12hr tablet Take 1 tablet by mouth 2 (two) times daily. 30 tablet Rhys Martini, PA-C   fluticasone [facility]) 50 MCG/ACT nasal spray Place 1 spray into both nostrils daily. 15 mL Rhys Martini, PA-C     PDMP not reviewed this encounter.   Rhys Martini, PA-C 03/02/20 1633

## 2020-03-02 NOTE — Discharge Instructions (Addendum)
-  Mucinex DM for congestion. You can take this in the daytime or nighttime.  -Flonase nasal steroid as needed for congestiona nd ear pressure.  We are currently awaiting result of your PCR covid-19 test. This typically comes back in 1-2 days. We'll call you if the result is positive. Otherwise, the result will be sent electronically to your MyChart. You can also call this clinic and ask for your result via telephone.   Please isolate at home while awaiting these results. If your test is positive for Covid-19, continue to isolate at home for 5 days if you have mild symptoms, or a total of 10 days from symptom onset if you have more severe symptoms. If you quarantine for a shorter period of time (i.e. 5 days), make sure to wear a mask until day 10 of symptoms. Treat your symptoms at home with OTC remedies like tylenol/ibuprofen, mucinex, nyquil, etc. Seek medical attention if you develop high fevers, chest pain, shortness of breath, ear pain, facial pain, etc. Make sure to get up and move around every 2-3 hours while convalescing to help prevent blood clots. Drink plenty of fluids, and rest as much as possible.

## 2020-03-03 LAB — SARS CORONAVIRUS 2 (TAT 6-24 HRS): SARS Coronavirus 2: NEGATIVE

## 2020-03-11 ENCOUNTER — Encounter (HOSPITAL_COMMUNITY): Payer: Self-pay

## 2020-03-11 ENCOUNTER — Ambulatory Visit (HOSPITAL_COMMUNITY)
Admission: EM | Admit: 2020-03-11 | Discharge: 2020-03-11 | Disposition: A | Discharge status: Home / Self Care | Payer: Medicare Other | Attending: Emergency Medicine | Admitting: Emergency Medicine

## 2020-03-11 ENCOUNTER — Other Ambulatory Visit: Payer: Self-pay

## 2020-03-11 DIAGNOSIS — Z7952 Long term (current) use of systemic steroids: Secondary | ICD-10-CM | POA: Diagnosis not present

## 2020-03-11 DIAGNOSIS — F17210 Nicotine dependence, cigarettes, uncomplicated: Secondary | ICD-10-CM | POA: Diagnosis not present

## 2020-03-11 DIAGNOSIS — R0981 Nasal congestion: Secondary | ICD-10-CM | POA: Diagnosis not present

## 2020-03-11 DIAGNOSIS — Z880 Allergy status to penicillin: Secondary | ICD-10-CM | POA: Diagnosis not present

## 2020-03-11 DIAGNOSIS — Z20822 Contact with and (suspected) exposure to covid-19: Secondary | ICD-10-CM | POA: Diagnosis not present

## 2020-03-11 DIAGNOSIS — B349 Viral infection, unspecified: Secondary | ICD-10-CM | POA: Insufficient documentation

## 2020-03-11 DIAGNOSIS — J45909 Unspecified asthma, uncomplicated: Secondary | ICD-10-CM | POA: Diagnosis not present

## 2020-03-11 LAB — SARS CORONAVIRUS 2 (TAT 6-24 HRS): SARS Coronavirus 2: NEGATIVE

## 2020-03-11 NOTE — ED Triage Notes (Signed)
Pt c/o body aches, nasal congestion, sore throat X 2 days. He states he started having a cough 3 days ago. He states he has taken Mucinex and Flonase. Pt denies SOB, chest pain, diarrhea, nausea.   He states last week his fiance was COVID positive.

## 2020-03-11 NOTE — ED Provider Notes (Signed)
[facility]    CSN: [id number] Arrival date & time: 03/11/20  0807      History   Chief Complaint Chief Complaint  Patient presents with  . Generalized Body Aches  . Nasal Congestion  . Sore Throat  . Cough    HPI Philip Ramos is a 25 y.o. male.   Patient presents with 2 to 3-day history of nonproductive cough, body aches, congestion, sore throat.  He denies fever, rash, shortness of breath, vomiting, diarrhea, or other symptoms.  Treatment attempted at home with OTC Mucinex and Flonase.  His fiance tested COVID positive this week.  Patient was seen here on 03/02/2020; diagnosed with URI and treated symptomatically; COVID negative.  His medical history includes asthma and current everyday smoker.  The history is provided by the patient and medical records.    Past Medical History:  Diagnosis Date  . Asthma     There are no problems to display for this patient.   History reviewed. No pertinent surgical history.     Home Medications    Prior to Admission medications   Medication Sig Start Date End Date Taking? Authorizing Provider  fluticasone (FLONASE) 50 MCG/ACT nasal spray Place 1 spray into both nostrils daily. 03/02/20  Yes Rhys Martini, PA-C  dextromethorphan-guaiFENesin [facility] DM) 30-600 MG 12hr tablet Take 1 tablet by mouth 2 (two) times daily. 03/02/20   Rhys Martini, PA-C  predniSONE (DELTASONE) 20 MG tablet 2 tabs po daily x 3 days 05/29/19   Fayrene Helper, PA-C  albuterol (PROVENTIL HFA;VENTOLIN HFA) 108 (90 Base) MCG/ACT inhaler Inhale 1-2 puffs into the lungs every 6 (six) hours as needed for wheezing or shortness of breath. Patient not taking: Reported on 05/29/2019 05/21/18 05/29/19  Eustace Moore, MD    Family History Family History  Problem Relation Age of Onset  . Hypertension Other   . Diabetes Other   . Cancer Other   . Cancer Mother   . Diabetes Mother   . Cancer Father   . Diabetes Father     Social History Social  History   Tobacco Use  . Smoking status: Current Every Day Smoker    Packs/day: 1.00    Types: Cigarettes, Cigars  . Smokeless tobacco: Never Used  . Tobacco comment: smokes black and milds  Vaping Use  . Vaping Use: Never used  Substance Use Topics  . Alcohol use: Yes    Alcohol/week: 1.0 standard drink    Types: 1 Cans of beer per week    Comment: occ  . Drug use: No     Allergies   Amoxicillin, Peanut-containing drug products, Penicillins, and Shellfish allergy   Review of Systems Review of Systems  Constitutional: Negative for chills and fever.  HENT: Positive for congestion and sore throat. Negative for ear pain.   Eyes: Negative for pain and visual disturbance.  Respiratory: Positive for cough. Negative for shortness of breath.   Cardiovascular: Negative for chest pain and palpitations.  Gastrointestinal: Negative for abdominal pain, diarrhea and vomiting.  Genitourinary: Negative for dysuria and hematuria.  Musculoskeletal: Negative for arthralgias and back pain.  Skin: Negative for color change and rash.  Neurological: Negative for seizures and syncope.  All other systems reviewed and are negative.    Physical Exam Triage Vital Signs ED Triage Vitals  Enc Vitals Group     BP      Pulse      Resp      Temp  Temp src      SpO2      Weight      Height      Head Circumference      Peak Flow      Pain Score      Pain Loc      Pain Edu?      Excl. in GC?    No data found.  Updated Vital Signs BP 114/68 (BP Location: Left Arm)   Pulse 74   Temp 97.8 F (36.6 C) (Oral)   Resp 17   SpO2 97%   Visual Acuity Right Eye Distance:   Left Eye Distance:   Bilateral Distance:    Right Eye Near:   Left Eye Near:    Bilateral Near:     Physical Exam Vitals and nursing note reviewed.  Constitutional:      General: He is not in acute distress.    Appearance: He is well-developed and well-nourished. He is not ill-appearing.  HENT:     Head:  Normocephalic and atraumatic.     Right Ear: Tympanic membrane normal.     Left Ear: Tympanic membrane normal.     Nose: Nose normal.     Mouth/Throat:     Mouth: Mucous membranes are moist.     Pharynx: Oropharynx is clear.  Eyes:     Conjunctiva/sclera: Conjunctivae normal.  Cardiovascular:     Rate and Rhythm: Normal rate and regular rhythm.     Heart sounds: Normal heart sounds.  Pulmonary:     Effort: Pulmonary effort is normal. No respiratory distress.     Breath sounds: Normal breath sounds.  Abdominal:     Palpations: Abdomen is soft.     Tenderness: There is no abdominal tenderness. There is no guarding or rebound.  Musculoskeletal:        General: No edema.     Cervical back: Neck supple.  Skin:    General: Skin is warm and dry.  Neurological:     General: No focal deficit present.     Mental Status: He is alert and oriented to person, place, and time.     Gait: Gait normal.  Psychiatric:        Mood and Affect: Mood and affect and mood normal.        Behavior: Behavior normal.      UC Treatments / Results  Labs (all labs ordered are listed, but only abnormal results are displayed) Labs Reviewed  SARS CORONAVIRUS 2 (TAT 6-24 HRS)    EKG   Radiology No results found.  Procedures Procedures (including critical care time)  Medications Ordered in UC Medications - No data to display  Initial Impression / Assessment and Plan / UC Course  I have reviewed the triage vital signs and the nursing notes.  Pertinent labs & imaging results that were available during my care of the patient were reviewed by me and considered in my medical decision making (see chart for details).   Viral illness. COVID pending.  Instructed patient to self quarantine until the test results are back.  Discussed symptomatic treatment including Tylenol, rest, hydration.  Instructed patient to follow up with PCP if his symptoms are not improving.  Patient agrees to plan of  care.    Final Clinical Impressions(s) / UC Diagnoses   Final diagnoses:  Viral illness     Discharge Instructions     Your COVID test is pending.  You should self quarantine until the test result  is back.    Take Tylenol or ibuprofen as needed for fever or discomfort.  Rest and keep yourself hydrated.    Follow-up with your primary care provider if your symptoms are not improving.        ED Prescriptions    None     PDMP not reviewed this encounter.   Mickie Bail, NP 03/11/20 [phone]

## 2020-03-11 NOTE — Discharge Instructions (Addendum)
Your COVID test is pending.  You should self quarantine until the test result is back.    Take Tylenol or ibuprofen as needed for fever or discomfort.  Rest and keep yourself hydrated.    Follow-up with your primary care provider if your symptoms are not improving.     

## 2020-03-30 DIAGNOSIS — R0789 Other chest pain: Secondary | ICD-10-CM | POA: Diagnosis not present

## 2020-03-30 DIAGNOSIS — M79601 Pain in right arm: Secondary | ICD-10-CM | POA: Diagnosis not present

## 2020-06-03 ENCOUNTER — Ambulatory Visit
Admission: EM | Admit: 2020-06-03 | Discharge: 2020-06-03 | Disposition: A | Discharge status: Home / Self Care | Payer: Medicare Other | Attending: Family Medicine | Admitting: Family Medicine

## 2020-06-03 ENCOUNTER — Ambulatory Visit (INDEPENDENT_AMBULATORY_CARE_PROVIDER_SITE_OTHER): Payer: Medicare Other

## 2020-06-03 ENCOUNTER — Other Ambulatory Visit: Payer: Self-pay

## 2020-06-03 DIAGNOSIS — R109 Unspecified abdominal pain: Secondary | ICD-10-CM | POA: Diagnosis not present

## 2020-06-03 DIAGNOSIS — M7918 Myalgia, other site: Secondary | ICD-10-CM

## 2020-06-03 DIAGNOSIS — R1031 Right lower quadrant pain: Secondary | ICD-10-CM

## 2020-06-03 LAB — POCT URINALYSIS DIP (MANUAL ENTRY)
Bilirubin, UA: NEGATIVE
Blood, UA: NEGATIVE
Glucose, UA: NEGATIVE mg/dL
Ketones, POC UA: NEGATIVE mg/dL
Nitrite, UA: NEGATIVE
Protein Ur, POC: NEGATIVE mg/dL
Spec Grav, UA: >=1.03 — AB (ref 1.010–1.025)
Urobilinogen, UA: 0.2 E.U./dL
pH, UA: 6.5 (ref 5.0–8.0)

## 2020-06-03 IMAGING — DX DG ABDOMEN 1V
2 series · 2 of 2 positions shown · non-contrast
Comparison: None.

CLINICAL DATA: Right flank pain

EXAM:
ABDOMEN - 1 VIEW

[abdomen supine ap (1 of 2)]
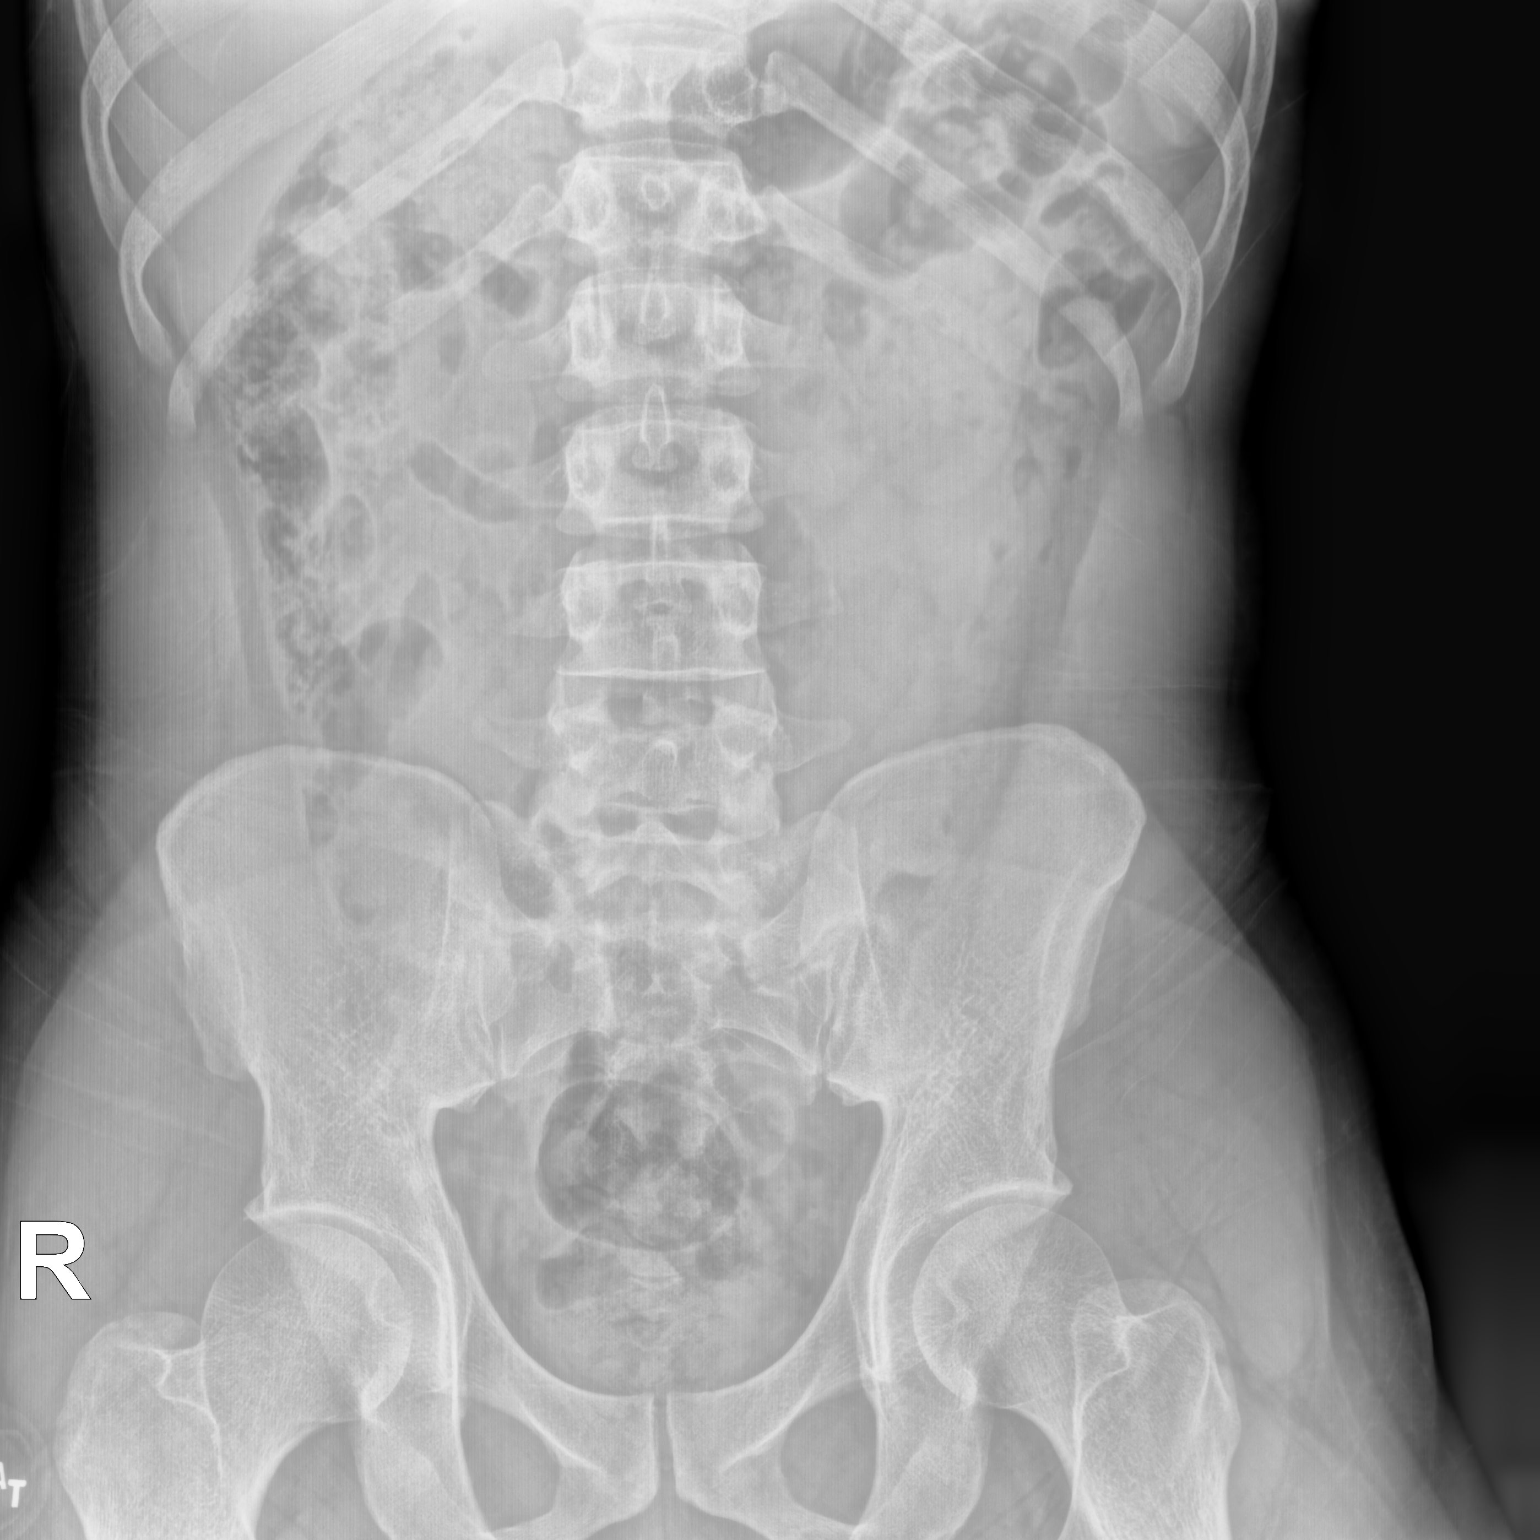

[abdomen supine ap (2 of 2)]
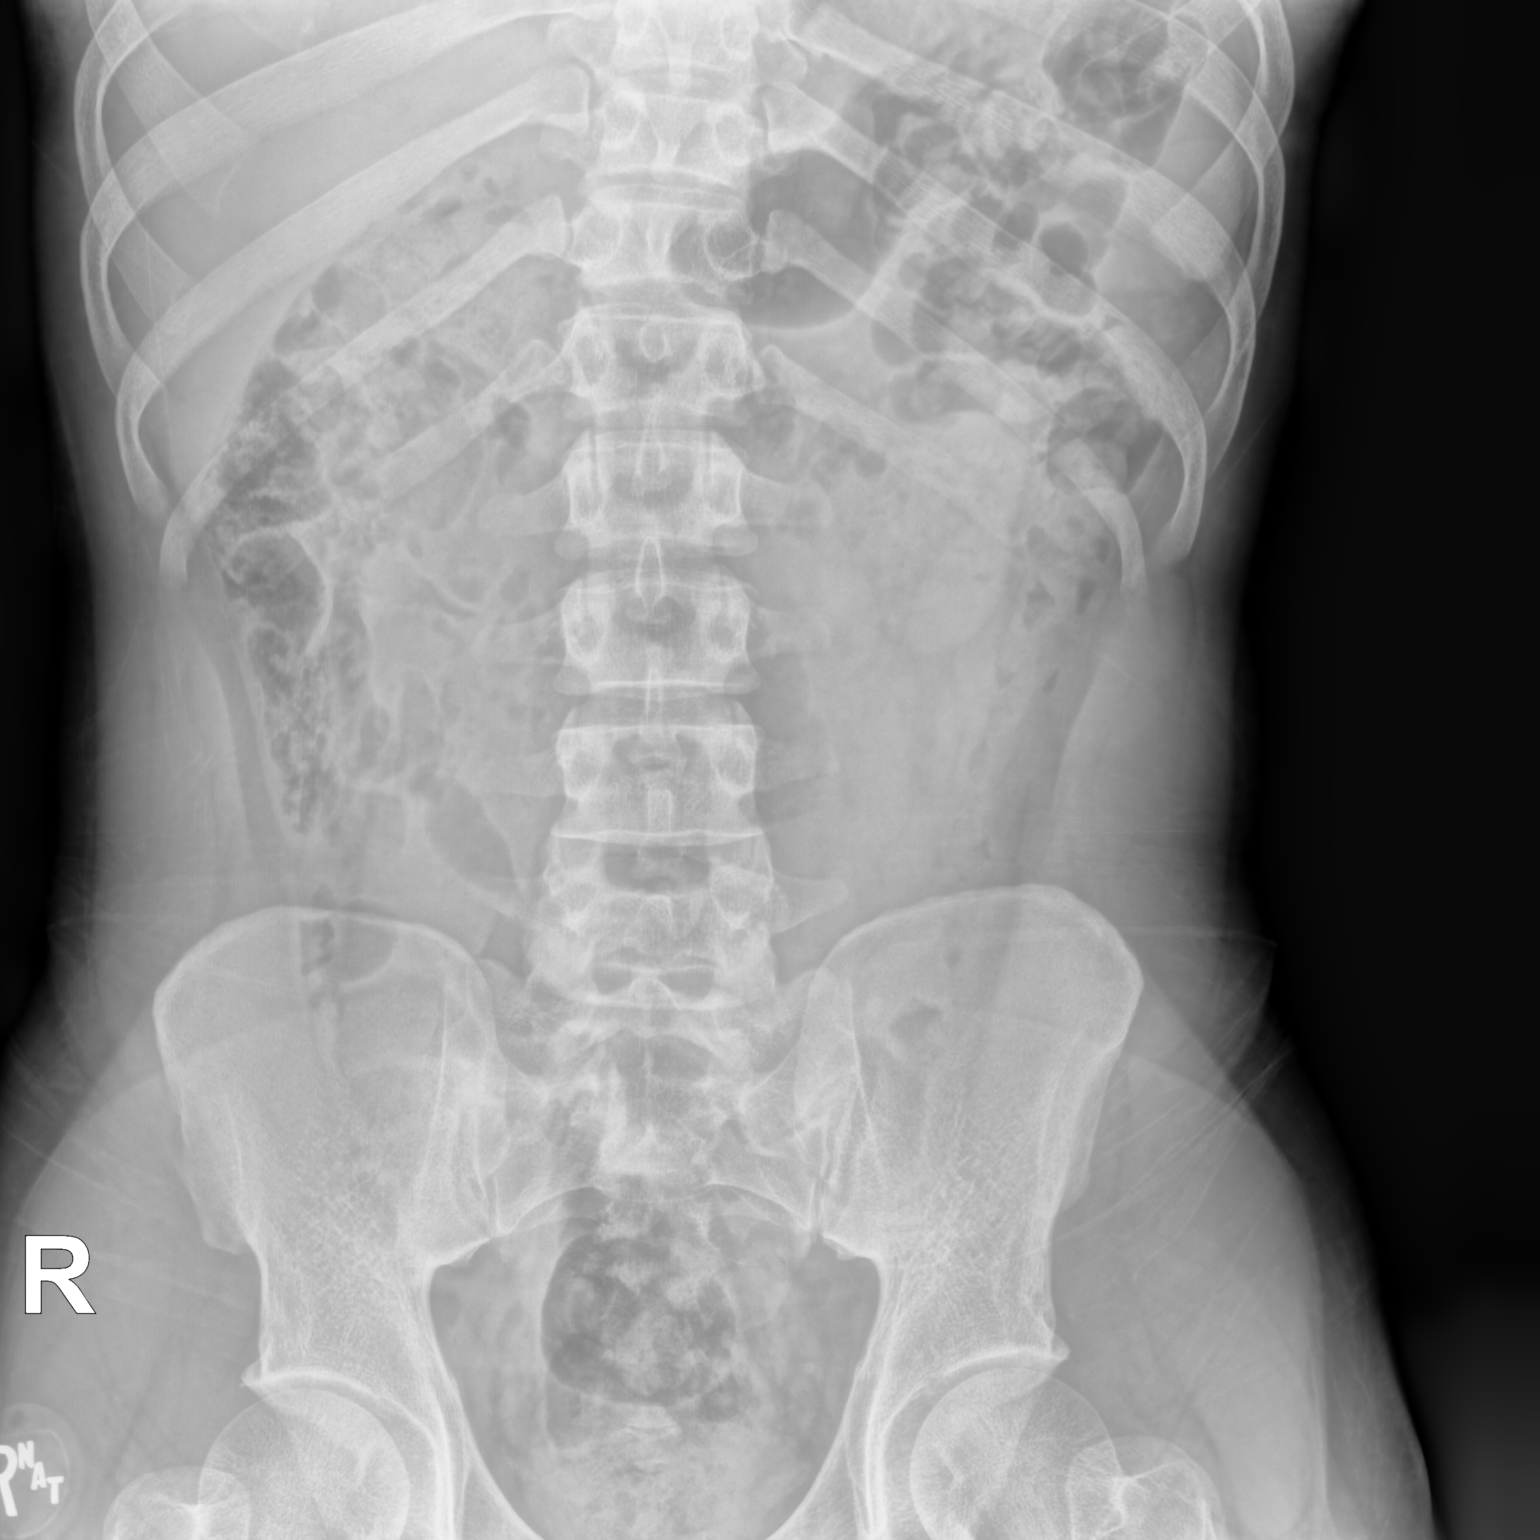

[2 of 2 positions shown; findings below may reference images not displayed]

FINDINGS: Two supine frontal views of the abdomen and pelvis are obtained. No
bowel obstruction or ileus. No masses or abnormal calcifications. No
acute bony abnormalities. Hemidiaphragms are excluded by
collimation.
IMPRESSION: 1. Unremarkable bowel gas pattern.
2. No radiopaque urinary tract calculi.

## 2020-06-03 MED ORDER — TIZANIDINE HCL 2 MG PO TABS: 2.0000 mg | ORAL_TABLET | Freq: Four times a day (QID) | ORAL | 0 refills | Status: DC | PRN

## 2020-06-03 MED ORDER — DICLOFENAC SODIUM 75 MG PO TBEC: 75.0000 mg | DELAYED_RELEASE_TABLET | Freq: Two times a day (BID) | ORAL | 0 refills | Status: AC

## 2020-06-03 NOTE — ED Triage Notes (Signed)
Pt c/o rt flank x2 days. Denies urinary sx's by hx of kidney stones. Pt c/o rt upper arm pain for months. States see a [facility] for arm.

## 2020-06-03 NOTE — ED Provider Notes (Signed)
[facility]    CSN: [id number] Arrival date & time: 06/03/20  1650      History   Chief Complaint Chief Complaint  Patient presents with  . Flank Pain  . Arm Pain    HPI Philip Ramos is a 25 y.o. male.   HPI Patient presents today with right flank pain x2 days.  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  The pain has gradually worsened over the course of the last 2 days.  He has been treated by his chiropractor for right upper arm pain and he has been taking the naproxen daily for the arm pain and hope that it would help with the flank pain.  He reports medication has been ineffective at resolving his pain.  He denies any dysuria or any hematuria. Past Medical History:  Diagnosis Date  . Asthma     There are no problems to display for this patient.   History reviewed. No pertinent surgical history.     Home Medications    Prior to Admission medications   Medication Sig Start Date End Date Taking? Authorizing Provider  albuterol (PROVENTIL HFA;VENTOLIN HFA) 108 (90 Base) MCG/ACT inhaler Inhale 1-2 puffs into the lungs every 6 (six) hours as needed for wheezing or shortness of breath. Patient not taking: Reported on 05/29/2019 05/21/18 05/29/19  Eustace Moore, MD    Family History Family History  Problem Relation Age of Onset  . Hypertension Other   . Diabetes Other   . Cancer Other   . Cancer Mother   . Diabetes Mother   . Cancer Father   . Diabetes Father     Social History Social History   Tobacco Use  . Smoking status: Current Every Day Smoker    Packs/day: 1.00    Types: Cigarettes, Cigars  . Smokeless tobacco: Never Used  . Tobacco comment: smokes black and milds  Vaping Use  . Vaping Use: Never used  Substance Use Topics  . Alcohol use: Yes    Alcohol/week: 1.0 standard drink    Types: 1 Cans of beer per week    Comment: occ  . Drug use: No      Allergies   Amoxicillin, Peanut-containing drug products, Penicillins, and Shellfish allergy   Review of Systems Review of Systems Pertinent negatives listed in HPI   Physical Exam Triage Vital Signs ED Triage Vitals [06/03/20 1830]  Enc Vitals Group     BP 128/81     Pulse Rate 69     Resp 18     Temp 97.9 F (36.6 C)     Temp Source Oral     SpO2 97 %     Weight      Height      Head Circumference      Peak Flow      Pain Score 10     Pain Loc      Pain Edu?      Excl. in GC?    No data found.  Updated Vital Signs BP 128/81 (BP Location: Left Arm)   Pulse 69   Temp 97.9 F (36.6 C) (Oral)   Resp 18   SpO2 97%   Visual Acuity Right Eye Distance:   Left Eye Distance:   Bilateral Distance:    Right Eye Near:   Left Eye Near:    Bilateral Near:     Physical  Exam General appearance: Alert, well developed, well nourished, cooperative  Head: Normocephalic, without obvious abnormality, atraumatic Respiratory: Respirations even and unlabored, normal respiratory rate Heart: Rate  and rhythm normal. No gallop or murmurs noted on exam  Abdomen: BS +,  no distention, no rebound tenderness, CVA tenderness present right Extremities: No gross deformities Skin: Skin color, texture, turgor normal. No rashes seen  Psych: Appropriate mood and affect. Neurologic: GCS 15, normal coordination, normal gait UC Treatments / Results  Labs (all labs ordered are listed, but only abnormal results are displayed) Labs Reviewed  POCT URINALYSIS DIP (MANUAL ENTRY) - Abnormal; Notable for the following components:      Result Value   Spec Grav, UA >=1.030 (*)    Leukocytes, UA Trace (*)    All other components within normal limits    EKG   Radiology No results found.  Procedures Procedures (including critical care time)  Medications Ordered in UC Medications - No data to display  Initial Impression / Assessment and Plan / UC Course  I have reviewed the triage  vital signs and the nursing notes.  Pertinent labs & imaging results that were available during my care of the patient were reviewed by me and considered in my medical decision making (see chart for details).    UA negative for hematuria however show some concentration indicative of mild dehydration.  No RBC present urine.  Given patient's occupation suspect symptoms are musculoskeletal in etiology therefore recommend treatment with NSAIDs and muscle relaxers per discharge instructions.  If symptoms worsen or do not improve recommend follow-up with orthopedic/primary care provider. Final Clinical Impressions(s) / UC Diagnoses   Final diagnoses:  Right flank pain  Pain in abdominal muscle of right flank     Discharge Instructions     Imaging negative for renal stone, Hold Naproxen. Start Voltaren 75 twice daily for 10 days then resume Naproxen as needed. For acute pain start Tizanidine 2-4 mg every 6 hours as needed for pain. If symptoms become severe, go to Emergency department If pain remains mild but doesn't resolve, follow-up with orthopedic office listed.    ED Prescriptions    Medication Sig Dispense Auth. Provider   diclofenac (VOLTAREN) 75 MG EC tablet Take 1 tablet (75 mg total) by mouth 2 (two) times daily for 10 days. 20 tablet Bing Neighbors, FNP   tiZANidine (ZANAFLEX) 2 MG tablet Take 1-2 tablets (2-4 mg total) by mouth every 6 (six) hours as needed for muscle spasms. 20 tablet Bing Neighbors, FNP     PDMP not reviewed this encounter.   Bing Neighbors, FNP 06/07/20 2030

## 2020-06-03 NOTE — Discharge Instructions (Addendum)
Imaging negative for renal stone, Hold Naproxen. Start Voltaren 75 twice daily for 10 days then resume Naproxen as needed. For acute pain start Tizanidine 2-4 mg every 6 hours as needed for pain. If symptoms become severe, go to Emergency department If pain remains mild but doesn't resolve, follow-up with orthopedic office listed.

## 2020-07-15 ENCOUNTER — Other Ambulatory Visit: Payer: Self-pay

## 2020-07-15 ENCOUNTER — Ambulatory Visit
Admission: EM | Admit: 2020-07-15 | Discharge: 2020-07-15 | Disposition: A | Discharge status: Home / Self Care | Payer: Medicare Other | Attending: Family Medicine | Admitting: Family Medicine

## 2020-07-15 DIAGNOSIS — M79601 Pain in right arm: Secondary | ICD-10-CM

## 2020-07-15 DIAGNOSIS — J069 Acute upper respiratory infection, unspecified: Secondary | ICD-10-CM | POA: Diagnosis not present

## 2020-07-15 DIAGNOSIS — M549 Dorsalgia, unspecified: Secondary | ICD-10-CM | POA: Diagnosis not present

## 2020-07-15 DIAGNOSIS — J4521 Mild intermittent asthma with (acute) exacerbation: Secondary | ICD-10-CM | POA: Diagnosis not present

## 2020-07-15 DIAGNOSIS — Z20822 Contact with and (suspected) exposure to covid-19: Secondary | ICD-10-CM | POA: Diagnosis not present

## 2020-07-15 MED ORDER — ALBUTEROL SULFATE HFA 108 (90 BASE) MCG/ACT IN AERS: 1.0000 | INHALATION_SPRAY | Freq: Four times a day (QID) | RESPIRATORY_TRACT | 0 refills | Status: DC | PRN

## 2020-07-15 MED ORDER — PREDNISONE 20 MG PO TABS: 40.0000 mg | ORAL_TABLET | Freq: Every day | ORAL | 0 refills | Status: DC

## 2020-07-15 MED ORDER — PROMETHAZINE-DM 6.25-15 MG/5ML PO SYRP: 5.0000 mL | ORAL_SOLUTION | Freq: Four times a day (QID) | ORAL | 0 refills | Status: DC | PRN

## 2020-07-15 NOTE — ED Provider Notes (Signed)
[facility]    CSN: [id number] Arrival date & time: 07/15/20  0903      History   Chief Complaint Chief Complaint  Patient presents with  . Cough    HPI Philip Ramos is a 25 y.o. male.   Patient presenting today with 3-day history of body aches, chills, sweats, fatigue and now congestion, cough, wheezing, chest tightness worse since yesterday.  He denies known fever, chest pain, shortness of breath, abdominal pain, nausea vomiting or diarrhea.  So far using his home albuterol inhaler with only mild temporary relief.  No known sick contacts.  He also states he has been having ongoing issues for over a month with right arm and upper back soreness, left flank soreness worse with movement.  He works a very physical job and feels this is exacerbating the issue.  Denies swelling, direct injury, decreased range of motion.  Has been given muscle relaxers in this clinic for this issue with minimal relief.  Has also tried chiropractic care with no relief.     Past Medical History:  Diagnosis Date  . Asthma     There are no problems to display for this patient.   History reviewed. No pertinent surgical history.     Home Medications    Prior to Admission medications   Medication Sig Start Date End Date Taking? Authorizing Provider  albuterol (VENTOLIN HFA) 108 (90 Base) MCG/ACT inhaler Inhale 1-2 puffs into the lungs every 6 (six) hours as needed for wheezing or shortness of breath. 07/15/20  Yes Particia Nearing, PA-C  predniSONE (DELTASONE) 20 MG tablet Take 2 tablets (40 mg total) by mouth daily with breakfast. 07/15/20  Yes Particia Nearing, PA-C  promethazine-dextromethorphan (PROMETHAZINE-DM) 6.25-15 MG/5ML syrup Take 5 mLs by mouth 4 (four) times daily as needed for cough. 07/15/20  Yes Particia Nearing, PA-C  tiZANidine (ZANAFLEX) 2 MG tablet Take 1-2 tablets (2-4 mg total) by mouth every 6 (six) hours as needed for muscle spasms. 06/03/20    Bing Neighbors, FNP    Family History Family History  Problem Relation Age of Onset  . Hypertension Other   . Diabetes Other   . Cancer Other   . Cancer Mother   . Diabetes Mother   . Cancer Father   . Diabetes Father     Social History Social History   Tobacco Use  . Smoking status: Current Every Day Smoker    Packs/day: 1.00    Types: Cigarettes, Cigars  . Smokeless tobacco: Never Used  . Tobacco comment: smokes black and milds  Vaping Use  . Vaping Use: Never used  Substance Use Topics  . Alcohol use: Yes    Alcohol/week: 1.0 standard drink    Types: 1 Cans of beer per week    Comment: occ  . Drug use: No     Allergies   Amoxicillin, Peanut-containing drug products, Penicillins, and Shellfish allergy   Review of Systems Review of Systems Per HPI  Physical Exam Triage Vital Signs ED Triage Vitals  Enc Vitals Group     BP 07/15/20 0955 115/80     Pulse Rate 07/15/20 0955 64     Resp 07/15/20 0955 18     Temp 07/15/20 0955 97.8 F (36.6 C)     Temp Source 07/15/20 0955 Oral     SpO2 07/15/20 0955 94 %     Weight --      Height --      Head Circumference --  Peak Flow --      Pain Score 07/15/20 0958 10     Pain Loc --      Pain Edu? --      Excl. in GC? --    No data found.  Updated Vital Signs BP 115/80 (BP Location: Left Arm)   Pulse 64   Temp 97.8 F (36.6 C) (Oral)   Resp 18   SpO2 94%   Visual Acuity Right Eye Distance:   Left Eye Distance:   Bilateral Distance:    Right Eye Near:   Left Eye Near:    Bilateral Near:     Physical Exam Vitals and nursing note reviewed.  Constitutional:      Appearance: Normal appearance.  HENT:     Head: Atraumatic.     Right Ear: Tympanic membrane normal.     Left Ear: Tympanic membrane normal.     Nose: Rhinorrhea present.     Mouth/Throat:     Mouth: Mucous membranes are moist.     Pharynx: Oropharynx is clear. Posterior oropharyngeal erythema present. No oropharyngeal exudate.   Eyes:     Extraocular Movements: Extraocular movements intact.     Conjunctiva/sclera: Conjunctivae normal.  Cardiovascular:     Rate and Rhythm: Normal rate and regular rhythm.  Pulmonary:     Effort: Pulmonary effort is normal.     Breath sounds: Normal breath sounds. No wheezing or rales.  Abdominal:     General: Bowel sounds are normal. There is no distension.     Palpations: Abdomen is soft.     Tenderness: There is no abdominal tenderness. There is no guarding.  Musculoskeletal:        General: Normal range of motion.     Cervical back: Normal range of motion and neck supple.     Comments: Good range of motion but reproduction of pain with range of motion against resistance exercises right arm  Skin:    General: Skin is warm and dry.  Neurological:     General: No focal deficit present.     Mental Status: He is oriented to person, place, and time.  Psychiatric:        Mood and Affect: Mood normal.        Thought Content: Thought content normal.        Judgment: Judgment normal.      UC Treatments / Results  Labs (all labs ordered are listed, but only abnormal results are displayed) Labs Reviewed  NOVEL CORONAVIRUS, NAA    EKG   Radiology No results found.  Procedures Procedures (including critical care time)  Medications Ordered in UC Medications - No data to display  Initial Impression / Assessment and Plan / UC Course  I have reviewed the triage vital signs and the nursing notes.  Pertinent labs & imaging results that were available during my care of the patient were reviewed by me and considered in my medical decision making (see chart for details).     Vitals and exam overall reassuring, suspect viral illness causing exacerbation of his asthma.  We will treat with prednisone burst, albuterol inhaler, Phenergan DM, Mucinex, supportive home care.  COVID PCR pending, isolation reviewed work note given.  Suspect his multi area pain from overuse with the  physical nature of his job.  Discussed some benefit may come from the prednisone he is taking for his breathing, follow-up closely with sports medicine for possible recommendations and to physical therapy.  Final Clinical Impressions(s) /  UC Diagnoses   Final diagnoses:  Viral URI with cough  Mild intermittent asthma with acute exacerbation  Right arm pain  Upper back pain on right side   Discharge Instructions   None    ED Prescriptions    Medication Sig Dispense Auth. Provider   predniSONE (DELTASONE) 20 MG tablet Take 2 tablets (40 mg total) by mouth daily with breakfast. 10 tablet Particia Nearing, PA-C   albuterol (VENTOLIN HFA) 108 (90 Base) MCG/ACT inhaler Inhale 1-2 puffs into the lungs every 6 (six) hours as needed for wheezing or shortness of breath. 18 g Roosvelt Maser Catharine, [place]   promethazine-dextromethorphan (PROMETHAZINE-DM) 6.25-15 MG/5ML syrup Take 5 mLs by mouth 4 (four) times daily as needed for cough. 100 mL Particia Nearing, [place]     PDMP not reviewed this encounter.   Particia Nearing, [place] 07/15/20 1047

## 2020-07-15 NOTE — ED Triage Notes (Signed)
Pt presents today with a one day h/o cough, HA, chills and general body aches. Cough is interfering with patient's sleep.  One episode yesterday of sob. Pt reports hx of asthma. He used his inhaler yesterday with no change in sxs. Denies n/v/d. No known exposure to illness.

## 2020-07-16 LAB — SARS-COV-2, NAA 2 DAY TAT

## 2020-07-16 LAB — NOVEL CORONAVIRUS, NAA: SARS-CoV-2, NAA: NOT DETECTED

## 2020-07-20 ENCOUNTER — Ambulatory Visit
Admission: EM | Admit: 2020-07-20 | Discharge: 2020-07-20 | Disposition: A | Discharge status: Home / Self Care | Payer: Medicare Other | Attending: Family Medicine | Admitting: Family Medicine

## 2020-07-20 ENCOUNTER — Encounter: Payer: Self-pay | Admitting: Emergency Medicine

## 2020-07-20 ENCOUNTER — Other Ambulatory Visit: Payer: Self-pay

## 2020-07-20 DIAGNOSIS — H6593 Unspecified nonsuppurative otitis media, bilateral: Secondary | ICD-10-CM | POA: Diagnosis not present

## 2020-07-20 DIAGNOSIS — H6121 Impacted cerumen, right ear: Secondary | ICD-10-CM

## 2020-07-20 MED ORDER — CETIRIZINE HCL 10 MG PO TABS: 10.0000 mg | ORAL_TABLET | Freq: Every day | ORAL | 0 refills | Status: DC

## 2020-07-20 MED ORDER — AZITHROMYCIN 250 MG PO TABS: ORAL_TABLET | ORAL | 0 refills | Status: DC

## 2020-07-20 NOTE — ED Provider Notes (Signed)
[facility]    CSN: [id number] Arrival date & time: 07/20/20  1358      History   Chief Complaint Chief Complaint  Patient presents with  . Ear Fullness    HPI Philip Ramos is a 25 y.o. male.   HPI Patient reports that he has had right ear pain and bilateral ear fullness over the last few days.  Ear fullness started on yesterday.  He has recently recovered from a upper respiratory illness.  He reports tragal tenderness of the right ear.  He denies fever. Past Medical History:  Diagnosis Date  . Asthma     There are no problems to display for this patient.   History reviewed. No pertinent surgical history.     Home Medications    Prior to Admission medications   Medication Sig Start Date End Date Taking? Authorizing Provider  albuterol (VENTOLIN HFA) 108 (90 Base) MCG/ACT inhaler Inhale 1-2 puffs into the lungs every 6 (six) hours as needed for wheezing or shortness of breath. 07/15/20  Yes Particia Nearing, PA-C  azithromycin (ZITHROMAX) 250 MG tablet Take 2 tabs PO x 1 dose, then 1 tab PO QD x 4 days 07/20/20  Yes Bing Neighbors, FNP  predniSONE (DELTASONE) 20 MG tablet Take 2 tablets (40 mg total) by mouth daily with breakfast. 07/15/20  Yes Particia Nearing, PA-C  promethazine-dextromethorphan (PROMETHAZINE-DM) 6.25-15 MG/5ML syrup Take 5 mLs by mouth 4 (four) times daily as needed for cough. 07/15/20  Yes Particia Nearing, PA-C  cetirizine (ZYRTEC) 10 MG tablet Take 1 tablet (10 mg total) by mouth daily. 07/20/20   Bing Neighbors, FNP  tiZANidine (ZANAFLEX) 2 MG tablet Take 1-2 tablets (2-4 mg total) by mouth every 6 (six) hours as needed for muscle spasms. 06/03/20   Bing Neighbors, FNP    Family History Family History  Problem Relation Age of Onset  . Hypertension Other   . Diabetes Other   . Cancer Other   . Cancer Mother   . Diabetes Mother   . Cancer Father   . Diabetes Father     Social History Social History    Tobacco Use  . Smoking status: Current Every Day Smoker    Packs/day: 1.00    Types: Cigarettes, Cigars  . Smokeless tobacco: Never Used  . Tobacco comment: smokes black and milds  Vaping Use  . Vaping Use: Never used  Substance Use Topics  . Alcohol use: Yes    Alcohol/week: 1.0 standard drink    Types: 1 Cans of beer per week    Comment: occ  . Drug use: No     Allergies   Amoxicillin, Peanut-containing drug products, Penicillins, and Shellfish allergy   Review of Systems Review of Systems Pertinent negatives listed in HPI  Physical Exam Triage Vital Signs ED Triage Vitals  Enc Vitals Group     BP 07/20/20 1538 113/71     Pulse Rate 07/20/20 1538 67     Resp 07/20/20 1538 18     Temp 07/20/20 1538 97.6 F (36.4 C)     Temp Source 07/20/20 1538 Oral     SpO2 07/20/20 1538 96 %     Weight --      Height --      Head Circumference --      Peak Flow --      Pain Score 07/20/20 1535 0     Pain Loc --      Pain  Edu? --      Excl. in GC? --    No data found.  Updated Vital Signs BP 113/71 (BP Location: Left Arm)   Pulse 67   Temp 97.6 F (36.4 C) (Oral)   Resp 18   SpO2 96%   Visual Acuity Right Eye Distance:   Left Eye Distance:   Bilateral Distance:    Right Eye Near:   Left Eye Near:    Bilateral Near:     Physical Exam  General Appearance:    Alert, cooperative, no distress  HENT:   Normocephalic, bilateral TM erythema with partial cerumen obstructing the right ear, nares patent.  Eyes:    PERRL, conjunctiva/corneas clear, EOM's intact       Lungs:     Clear to auscultation bilaterally, respirations unlabored  Heart:    Regular rate and rhythm  Neurologic:   Awake, alert, oriented x 3. No apparent focal neurological           defect.      UC Treatments / Results  Labs (all labs ordered are listed, but only abnormal results are displayed) Labs Reviewed - No data to display  EKG   Radiology No results  found.  Procedures Procedures (including critical care time)  Medications Ordered in UC Medications - No data to display  Initial Impression / Assessment and Plan / UC Course  I have reviewed the triage vital signs and the nursing notes.  Pertinent labs & imaging results that were available during my care of the patient were reviewed by me and considered in my medical decision making (see chart for details).     Patient presents today with bilateral otitis media with a bilateral effusion.  Right ear is slightly obstructed with cerumen impaction.  Right ear irrigated with successful removal of excess cerumen.  Patient being treated per discharge medications below.  Work note provided to return to work tomorrow Final Clinical Impressions(s) / UC Diagnoses   Final diagnoses:  Bilateral otitis media with effusion  Hearing loss of right ear due to cerumen impaction   Discharge Instructions   None    ED Prescriptions    Medication Sig Dispense Auth. Provider   azithromycin (ZITHROMAX) 250 MG tablet Take 2 tabs PO x 1 dose, then 1 tab PO QD x 4 days 6 tablet Bing Neighbors, FNP   cetirizine (ZYRTEC) 10 MG tablet  (Status: Discontinued) Take 1 tablet (10 mg total) by mouth daily. 90 tablet Bing Neighbors, FNP   cetirizine (ZYRTEC) 10 MG tablet Take 1 tablet (10 mg total) by mouth daily. 90 tablet Bing Neighbors, FNP     PDMP not reviewed this encounter.   Bing Neighbors, FNP 07/20/20 [phone]

## 2020-07-20 NOTE — ED Triage Notes (Signed)
Ear fullness started yesterday after being in shower.  Right ear pain, denies being able to hear in the right ear.    Treated for a cough last week.   Denies placing anything in ear

## 2020-07-26 DIAGNOSIS — M5412 Radiculopathy, cervical region: Secondary | ICD-10-CM | POA: Diagnosis not present

## 2020-07-26 DIAGNOSIS — M542 Cervicalgia: Secondary | ICD-10-CM | POA: Diagnosis not present

## 2020-07-28 ENCOUNTER — Other Ambulatory Visit: Payer: Self-pay | Admitting: Orthopedic Surgery

## 2020-07-28 DIAGNOSIS — M542 Cervicalgia: Secondary | ICD-10-CM

## 2020-07-28 DIAGNOSIS — M5412 Radiculopathy, cervical region: Secondary | ICD-10-CM

## 2020-08-06 ENCOUNTER — Other Ambulatory Visit: Payer: Self-pay

## 2020-08-06 ENCOUNTER — Other Ambulatory Visit: Payer: Self-pay | Admitting: Family Medicine

## 2020-08-06 ENCOUNTER — Ambulatory Visit
Admission: RE | Admit: 2020-08-06 | Discharge: 2020-08-06 | Disposition: A | Discharge status: Home / Self Care | Payer: Medicare Other | Source: Ambulatory Visit | Attending: Orthopedic Surgery | Admitting: Orthopedic Surgery

## 2020-08-06 DIAGNOSIS — M5412 Radiculopathy, cervical region: Secondary | ICD-10-CM

## 2020-08-06 DIAGNOSIS — M542 Cervicalgia: Secondary | ICD-10-CM

## 2020-08-06 IMAGING — MR MR CERVICAL SPINE W/O CM
5 of 6 series · 33 of 48 positions shown · non-contrast
Comparison: None.
COMPARISON: None.

Addendum:
CLINICAL DATA: Right-sided neck pain radiating into the right arm.

EXAM:
MRI CERVICAL SPINE WITHOUT CONTRAST
TECHNIQUE: Multiplanar, multisequence MR imaging of the cervical spine was
performed. No intravenous contrast was administered.

[Series 9: T2 · sagittal · 3.0mm · 0.55mm/px · 7 of 27 slices shown (1 of 3)]
[im 1/27]
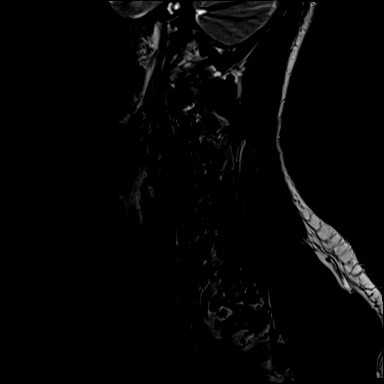
[im 5/27]
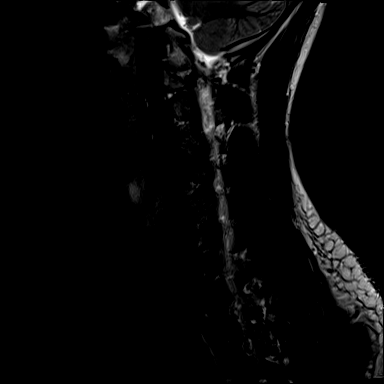
[im 9/27]
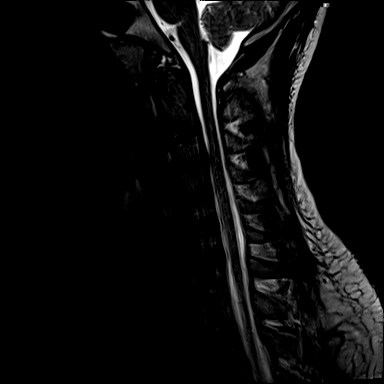
[im 14/27]
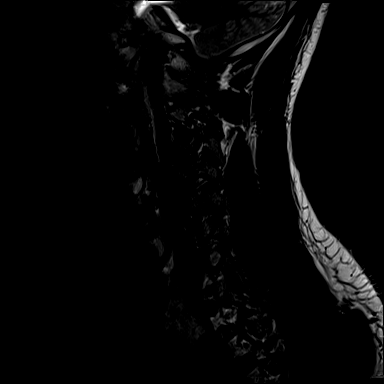
[im 18/27]
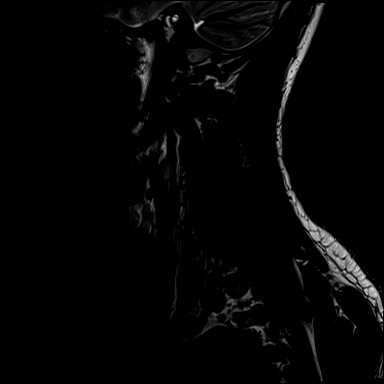
[im 22/27]
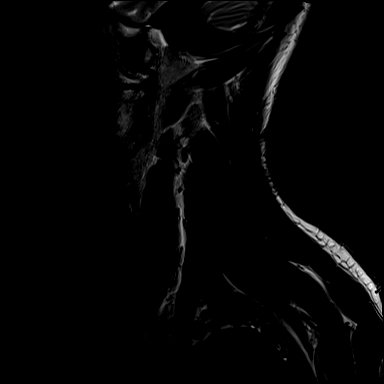
[im 27/27]
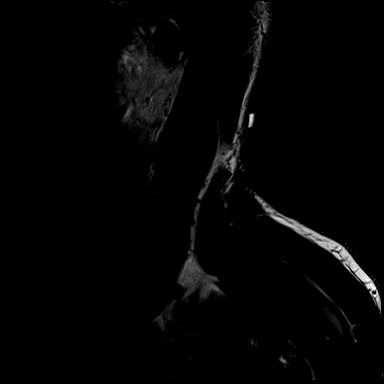

[Series 10: T2 · sagittal · 3.0mm · 0.55mm/px · 7 of 27 slices shown (2 of 3)]
[im 1/27]
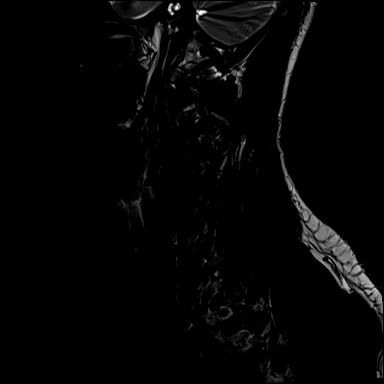
[im 5/27]
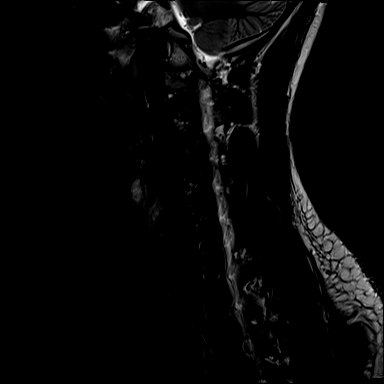
[im 9/27]
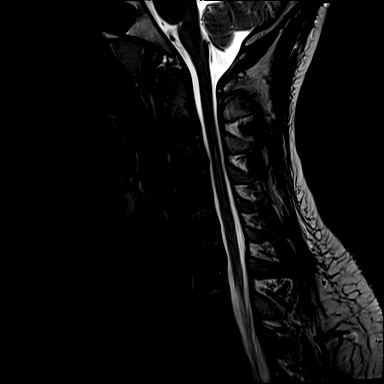
[im 14/27]
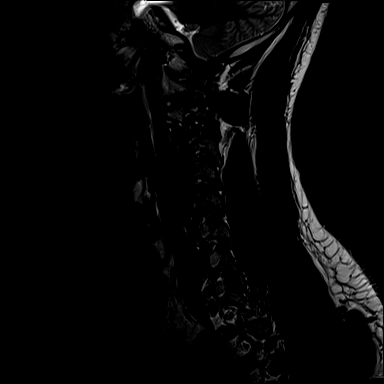
[im 18/27]
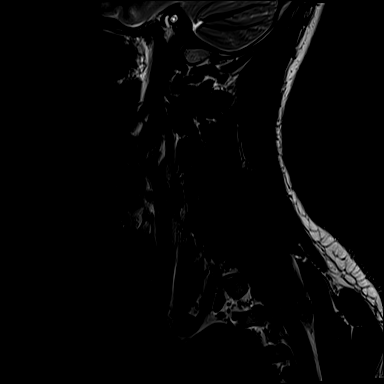
[im 22/27]
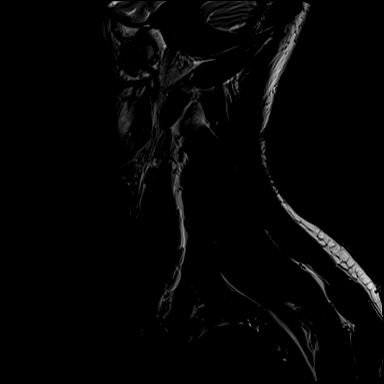
[im 27/27]
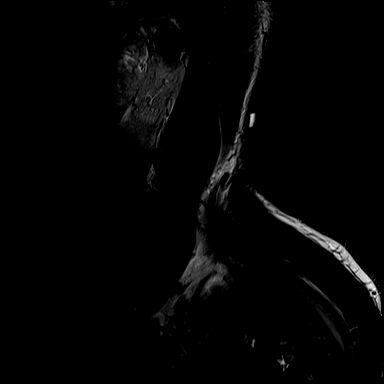

[Series 11: T1 · sagittal · 3.0mm · 0.66mm/px · 8 of 28 slices shown]
[im 1/28]
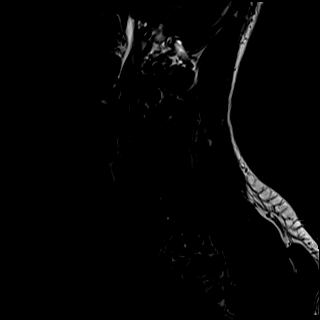
[im 4/28]
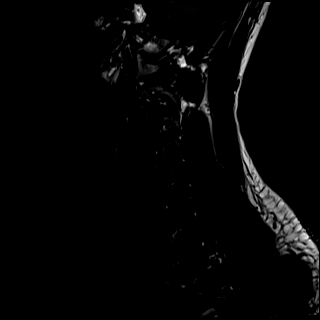
[im 8/28]
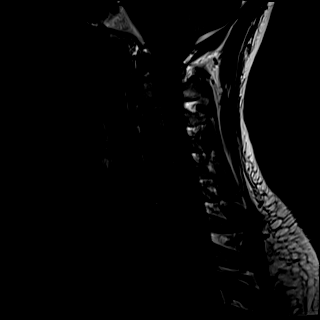
[im 12/28]
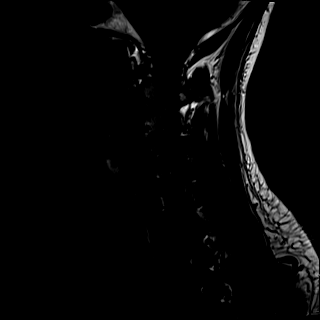
[im 16/28]
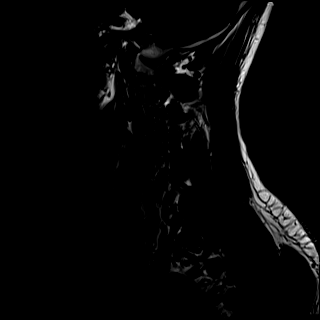
[im 20/28]
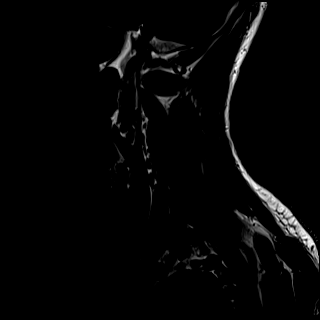
[im 24/28]
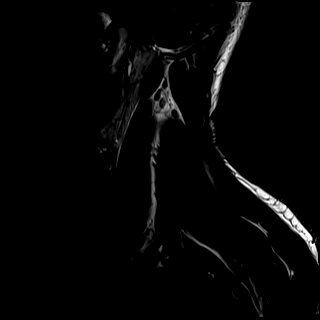
[im 28/28]
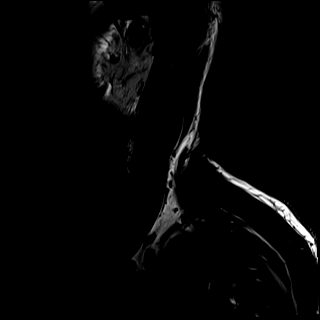

[Series 12: STIR · sagittal · 3.0mm · 0.33mm/px · 2 of 28 slices shown]
[im 1/28]
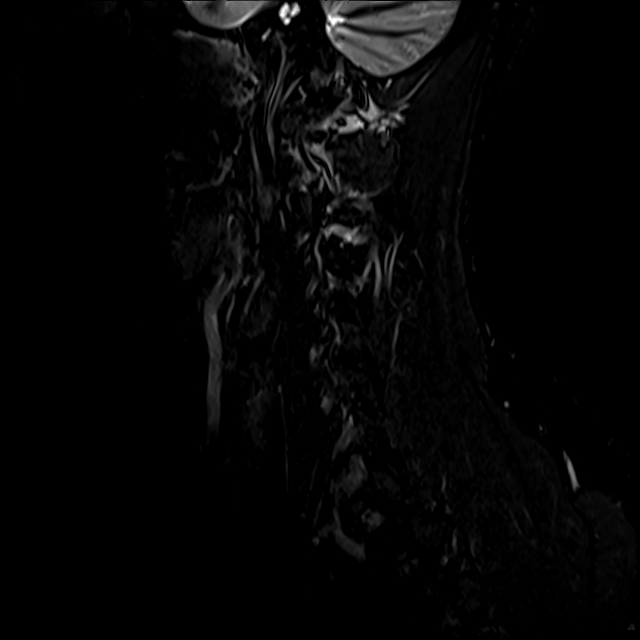
[im 4/28]
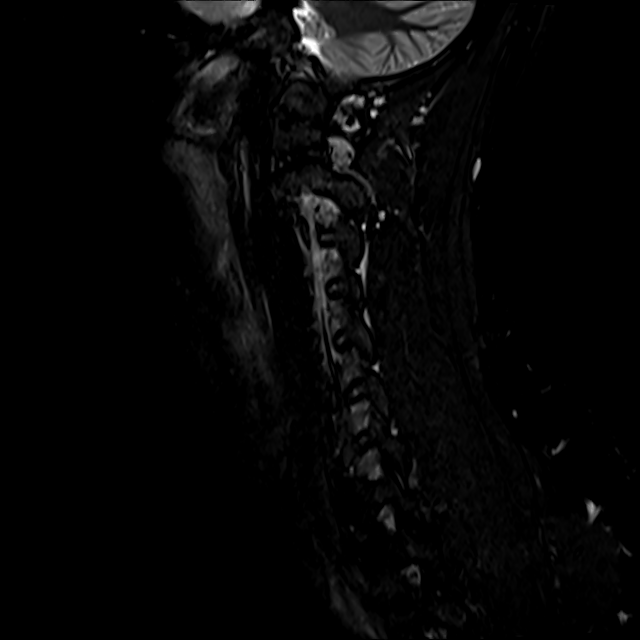

[Series 13: T2 · axial · 3.0mm · 0.50mm/px · z∈[-44,+63]mm · 9 of 34 slices shown (3 of 3)]
[im 1/34]
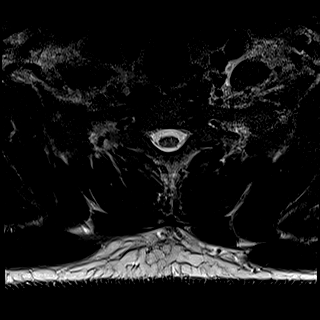
[im 5/34]
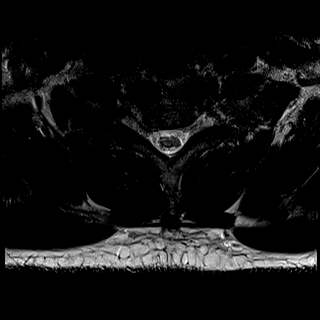
[im 9/34]
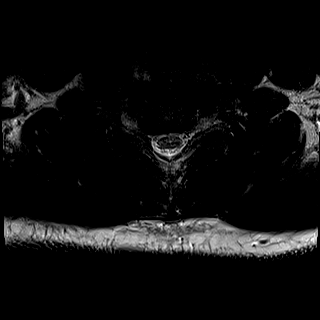
[im 13/34]
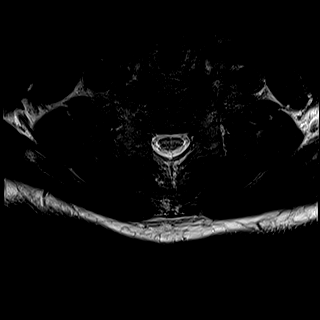
[im 17/34]
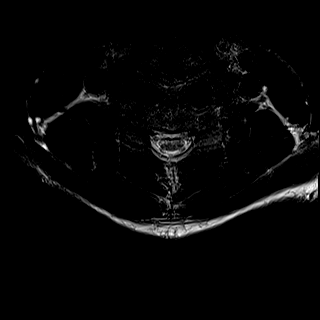
[im 21/34]
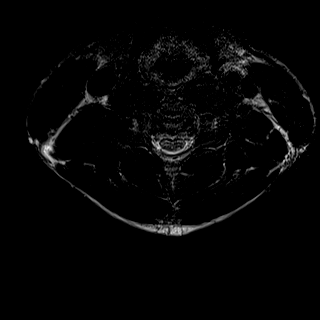
[im 25/34]
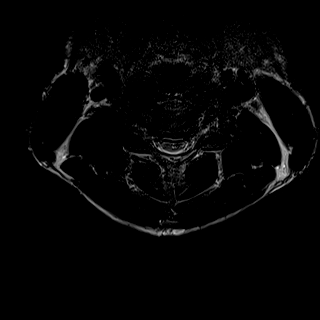
[im 29/34]
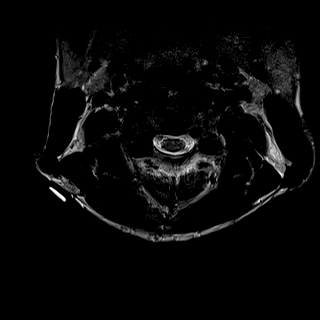
[im 34/34]
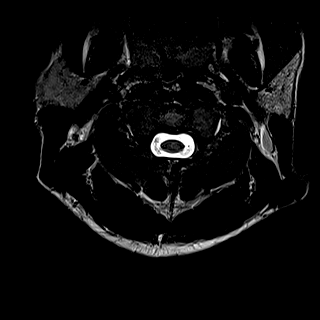

[33 of 48 positions shown; findings below may reference images not displayed]

FINDINGS: The study is mildly motion degraded.

Alignment: Straightening of the normal cervical lordosis. No
listhesis.

Vertebrae: No fracture, suspicious marrow lesion, or significant
marrow edema.

Cord: Normal signal and morphology.

Posterior Fossa, vertebral arteries, paraspinal tissues:
Unremarkable.

Disc levels:

C2-3: Negative.

C3-4: Small left central disc protrusion without stenosis.

C4-5: Negative.

C5-6: Minimal right uncovertebral spurring without significant
stenosis.

C6-7: A right foraminal disc osteophyte complex results in moderate
right neural foraminal stenosis with potential right C7 nerve root
impingement. Patent spinal canal and left neural foramen.

C7-T1: Negative.
IMPRESSION: 1. Right foraminal disc osteophyte complex at C6-7 with moderate
right neural foraminal stenosis.
2. Small left central disc protrusion at C3-4 without stenosis.

ADDENDUM:
The palpable abnormality in the posterior right upper neck
corresponds to a small, likely benign level V lymph node measuring 5
mm in short axis.

*** End of Addendum ***
FINDINGS: The study is mildly motion degraded.

Alignment: Straightening of the normal cervical lordosis. No
listhesis.

Vertebrae: No fracture, suspicious marrow lesion, or significant
marrow edema.

Cord: Normal signal and morphology.

Posterior Fossa, vertebral arteries, paraspinal tissues:
Unremarkable.

Disc levels:

C2-3: Negative.

C3-4: Small left central disc protrusion without stenosis.

C4-5: Negative.

C5-6: Minimal right uncovertebral spurring without significant
stenosis.

C6-7: A right foraminal disc osteophyte complex results in moderate
right neural foraminal stenosis with potential right C7 nerve root
impingement. Patent spinal canal and left neural foramen.

C7-T1: Negative.
IMPRESSION: 1. Right foraminal disc osteophyte complex at C6-7 with moderate
right neural foraminal stenosis.
2. Small left central disc protrusion at C3-4 without stenosis.

## 2020-08-06 IMAGING — MR MR THORACIC SPINE W/O CM
5 of 6 series · 24 of 48 positions shown · non-contrast
Comparison: None.

CLINICAL DATA: Right-sided neck pain radiating into the right arm.

EXAM:
MRI THORACIC SPINE WITHOUT CONTRAST
TECHNIQUE: Multiplanar, multisequence MR imaging of the thoracic spine was
performed. No intravenous contrast was administered.

[Series 16: t1_tse_sag count · sagittal · 3.0mm · 1.06mm/px · 1 of 13 slices shown]
[im 1/13]
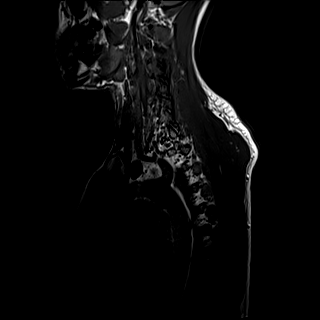

[Series 17: T1 · sagittal · 3.0mm · 0.91mm/px · 5 of 15 slices shown]
[im 1/15]
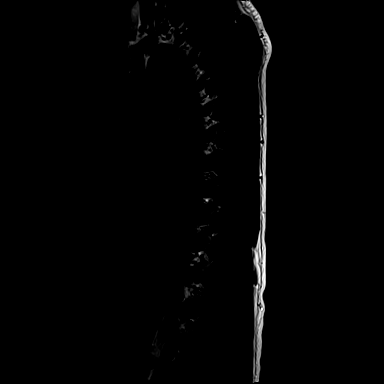
[im 4/15]
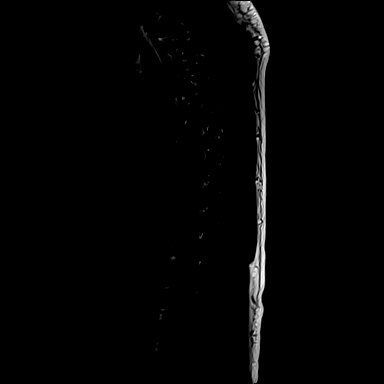
[im 8/15]
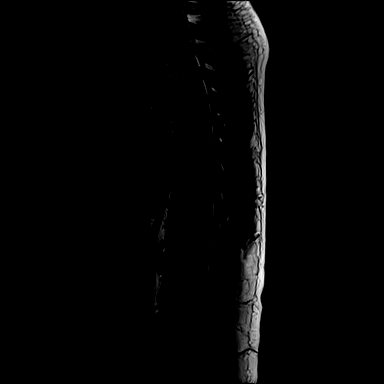
[im 11/15]
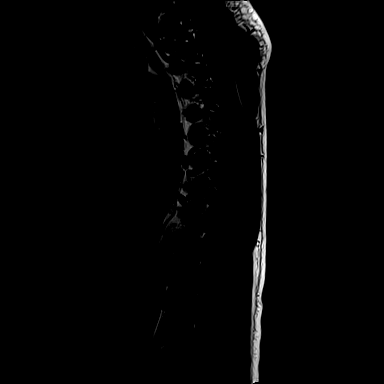
[im 15/15]
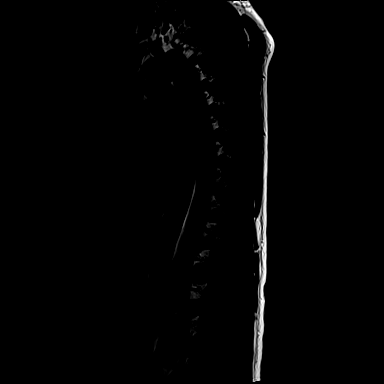

[Series 19: T2 · sagittal · 3.0mm · 0.91mm/px · 5 of 15 slices shown (1 of 2)]
[im 1/15]
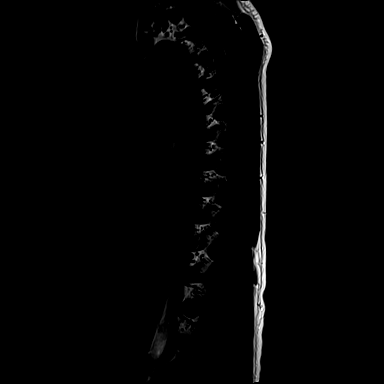
[im 4/15]
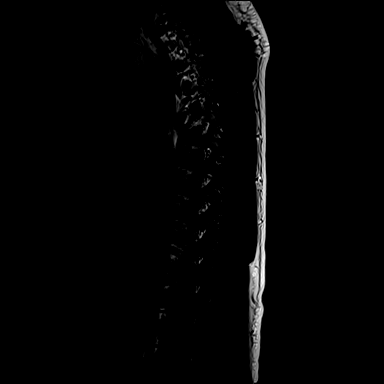
[im 8/15]
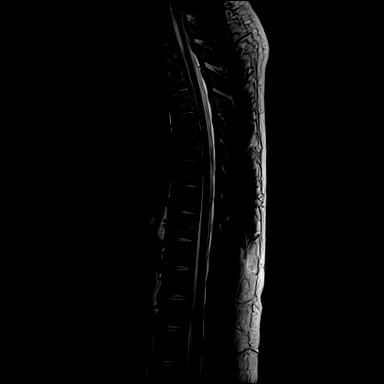
[im 11/15]
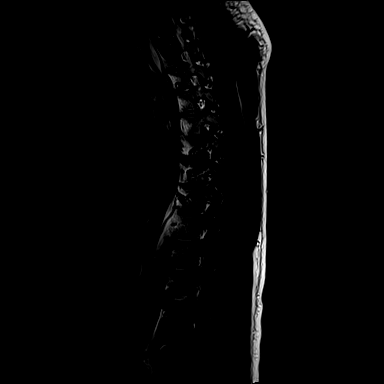
[im 15/15]
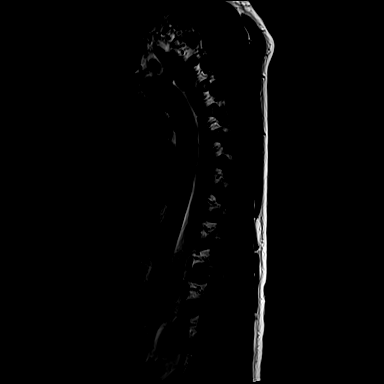

[Series 20: STIR · sagittal · 3.0mm · 1.09mm/px · 5 of 15 slices shown]
[im 1/15]
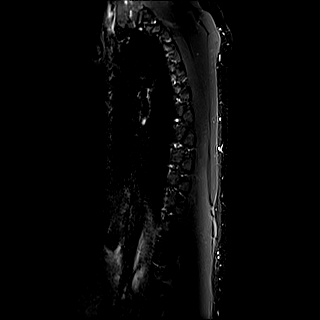
[im 4/15]
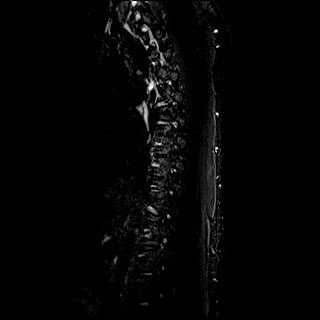
[im 8/15]
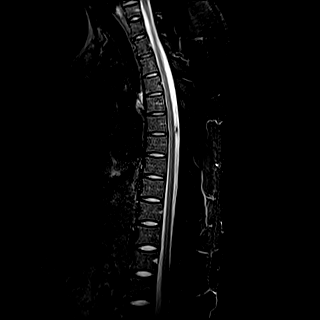
[im 11/15]
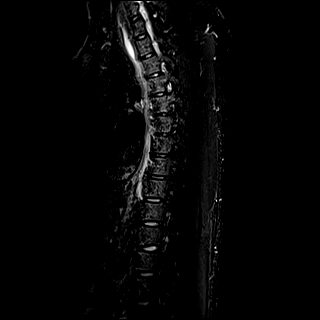
[im 15/15]
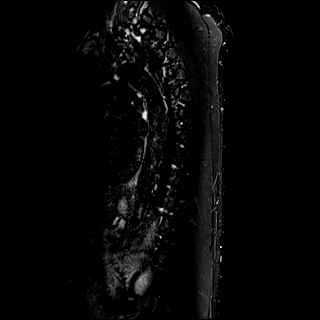

[Series 21: T2 · axial · 4.0mm · 0.28mm/px · z∈[-330,-37]mm · 8 of 42 slices shown (2 of 2)]
[im 1/42]
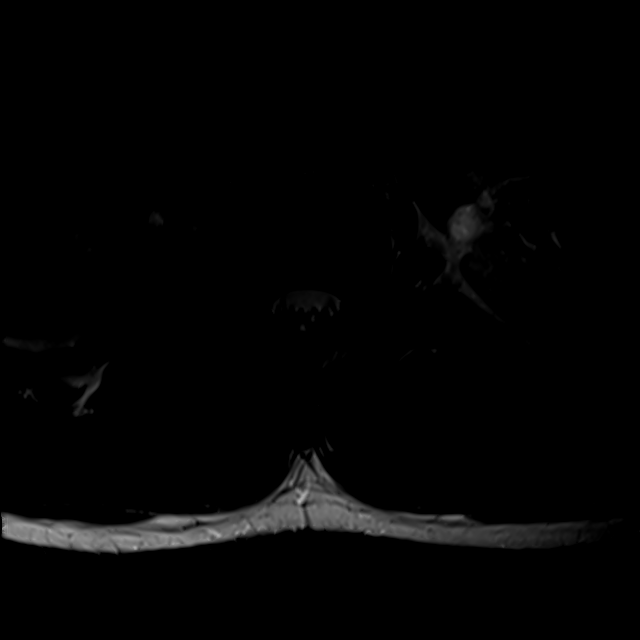
[im 7/42]
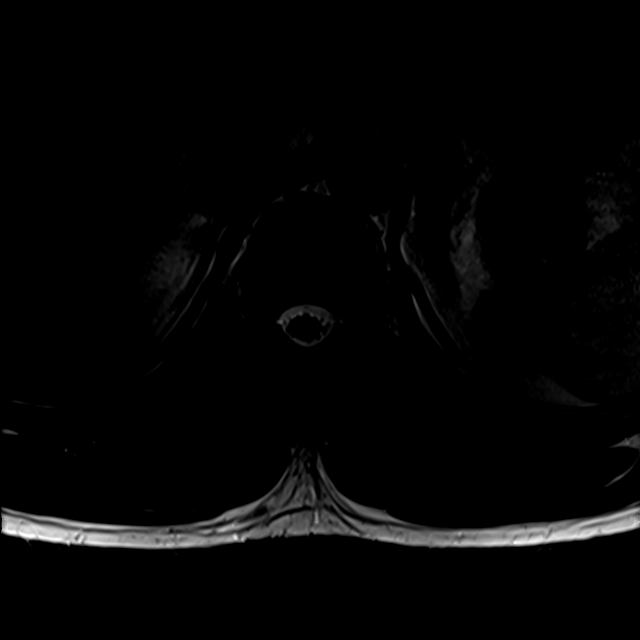
[im 13/42]
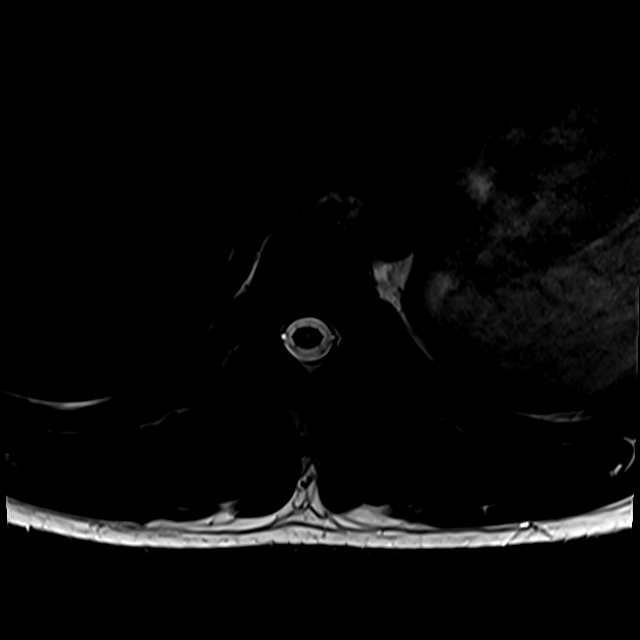
[im 19/42]
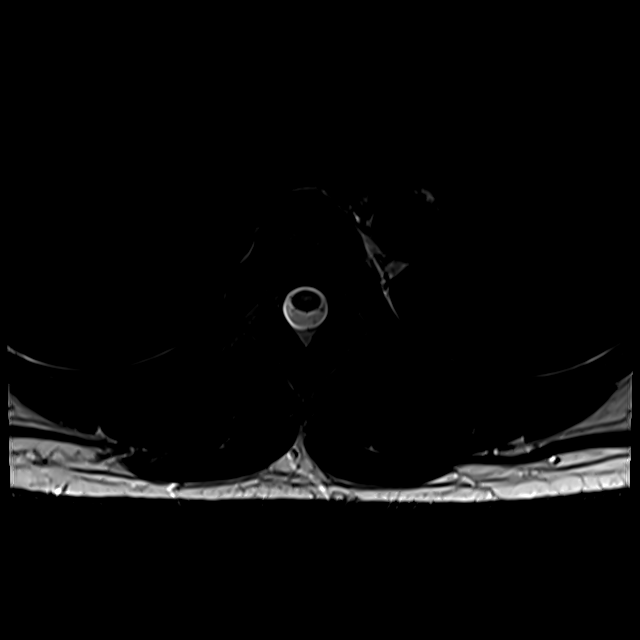
[im 23/42]
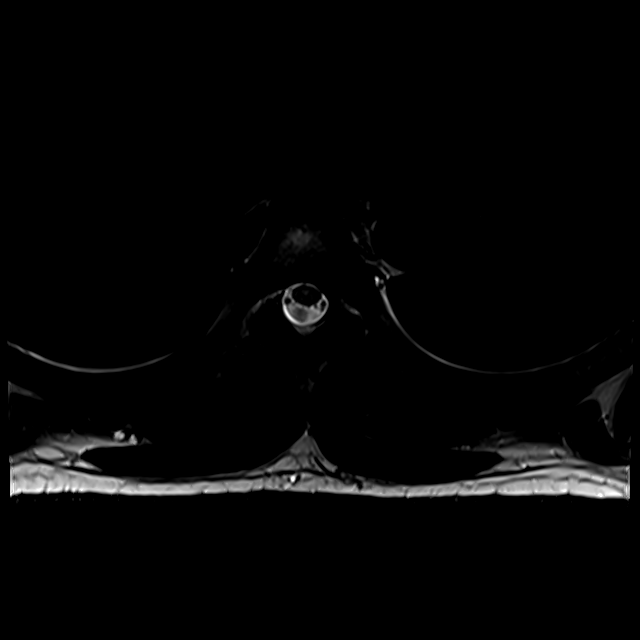
[im 29/42]
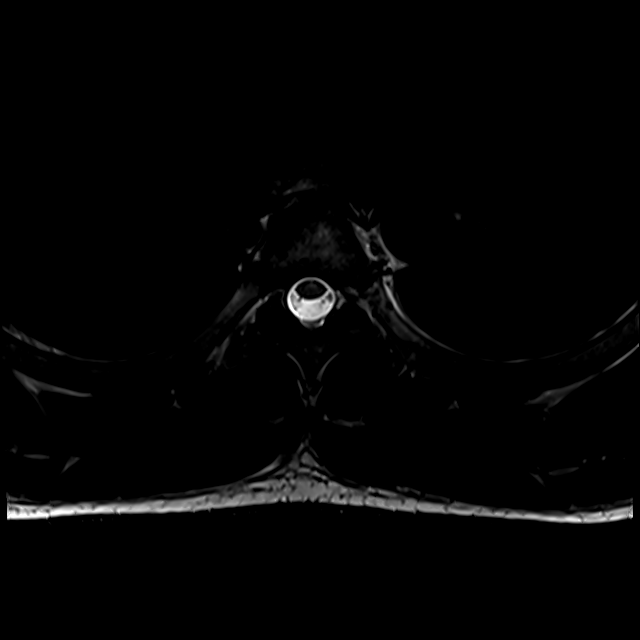
[im 35/42]
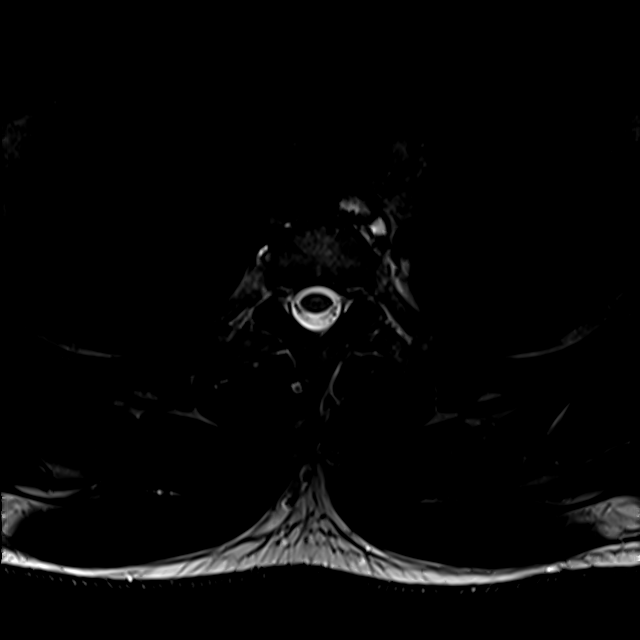
[im 42/42]
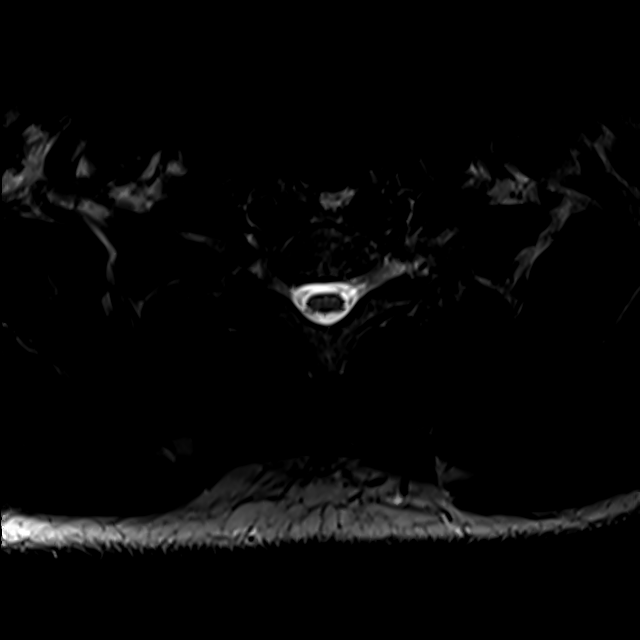

[24 of 48 positions shown; findings below may reference images not displayed]

FINDINGS: Alignment:  Normal.

Vertebrae: No fracture, suspicious marrow lesion, or significant
marrow edema.

Cord:  Normal signal and morphology.

Paraspinal and other soft tissues: Unremarkable.

Disc levels:

Disc height and hydration are preserved throughout the thoracic
spine. There is a shallow left paracentral disc protrusion at T6-7,
and there may also be a tiny left paracentral disc protrusion at
T5-6. There is no spinal cord mass effect, and the spinal canal and
neural foramina are widely patent throughout.
IMPRESSION: Shallow disc protrusions at T6-7 and possibly T5-6 without stenosis.

## 2020-08-07 ENCOUNTER — Other Ambulatory Visit: Payer: Medicare Other

## 2020-08-10 DIAGNOSIS — M5412 Radiculopathy, cervical region: Secondary | ICD-10-CM | POA: Diagnosis not present

## 2020-08-19 DIAGNOSIS — M5412 Radiculopathy, cervical region: Secondary | ICD-10-CM | POA: Diagnosis not present

## 2020-11-24 DIAGNOSIS — Z03818 Encounter for observation for suspected exposure to other biological agents ruled out: Secondary | ICD-10-CM | POA: Diagnosis not present

## 2020-11-24 DIAGNOSIS — Z20822 Contact with and (suspected) exposure to covid-19: Secondary | ICD-10-CM | POA: Diagnosis not present

## 2021-01-15 ENCOUNTER — Other Ambulatory Visit: Payer: Self-pay

## 2021-01-15 ENCOUNTER — Ambulatory Visit
Admission: EM | Admit: 2021-01-15 | Discharge: 2021-01-15 | Disposition: A | Discharge status: Home / Self Care | Payer: Worker's Compensation | Attending: Internal Medicine | Admitting: Internal Medicine

## 2021-01-15 ENCOUNTER — Ambulatory Visit (INDEPENDENT_AMBULATORY_CARE_PROVIDER_SITE_OTHER): Payer: Worker's Compensation

## 2021-01-15 DIAGNOSIS — S99921A Unspecified injury of right foot, initial encounter: Secondary | ICD-10-CM

## 2021-01-15 DIAGNOSIS — M79671 Pain in right foot: Secondary | ICD-10-CM

## 2021-01-15 IMAGING — DX DG FOOT COMPLETE 3+V*R*
3 series · 3 of 3 positions shown · non-contrast
Comparison: None.

CLINICAL DATA: Right foot pain and swelling after injury.

EXAM:
RIGHT FOOT COMPLETE - 3+ VIEW

[foot supine dp]
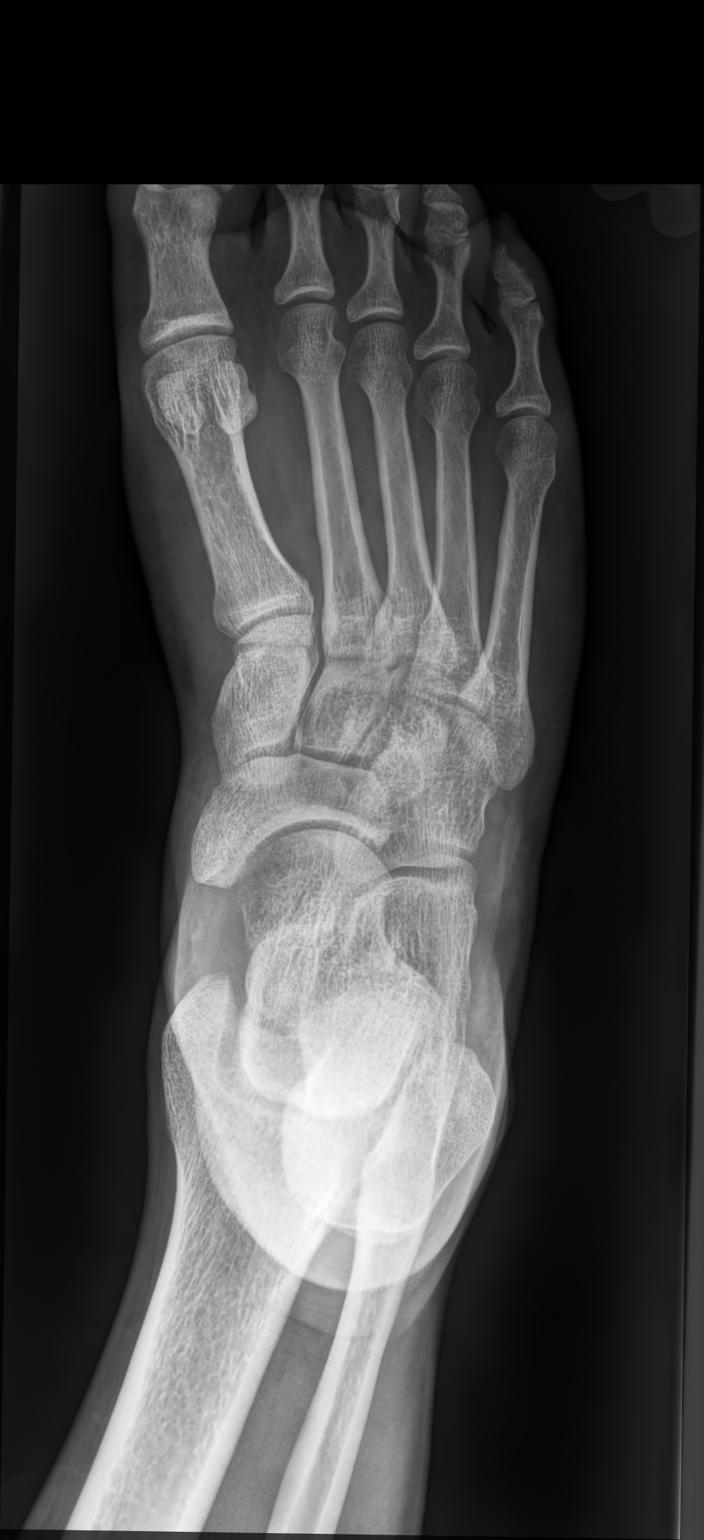

[foot medial oblique]
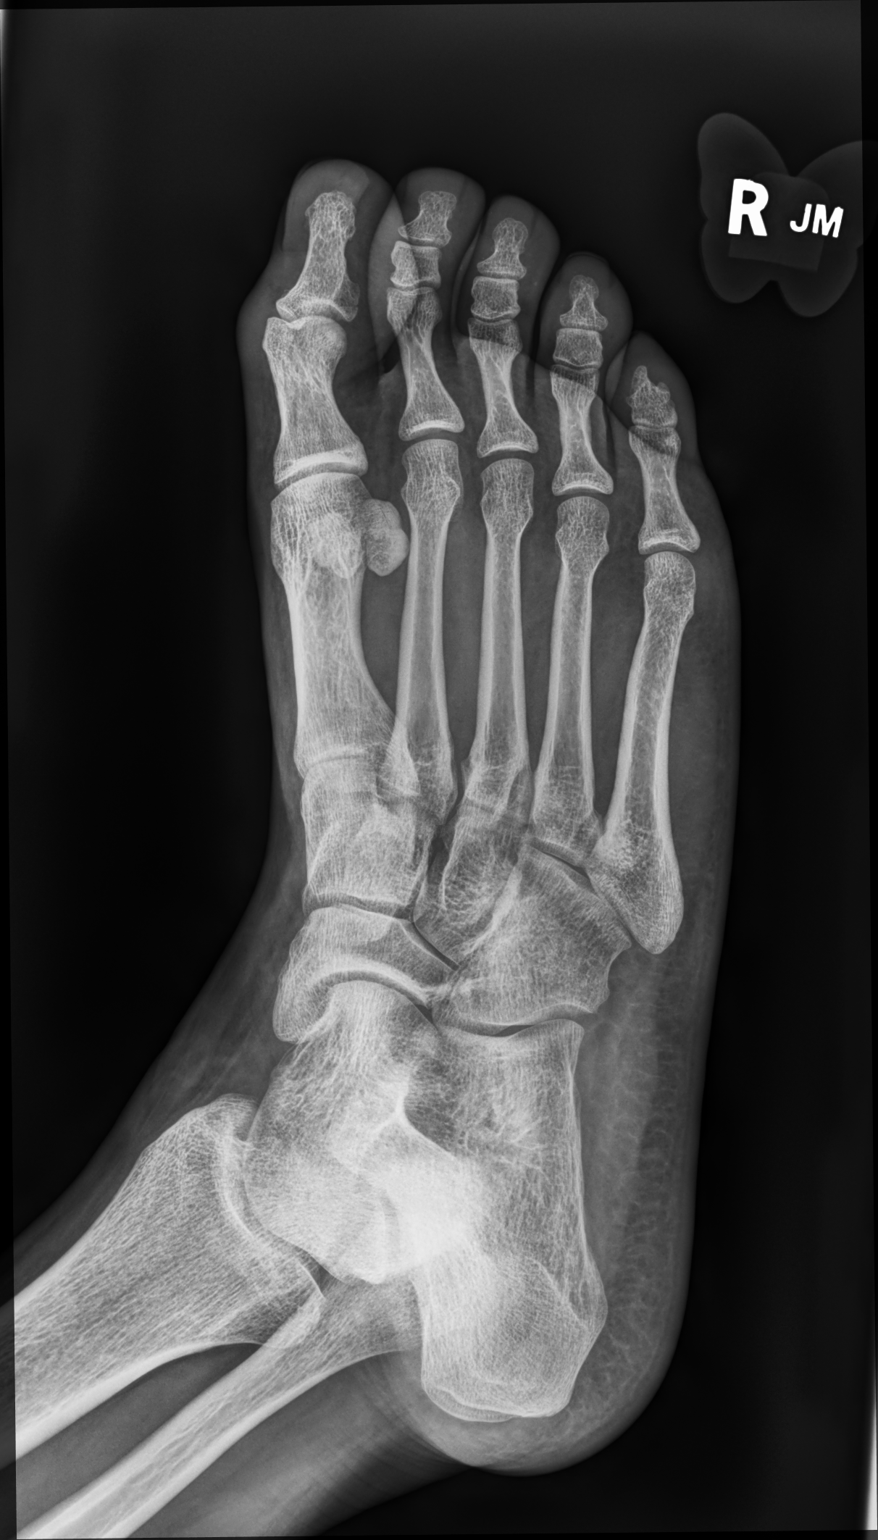

[foot supine lat]
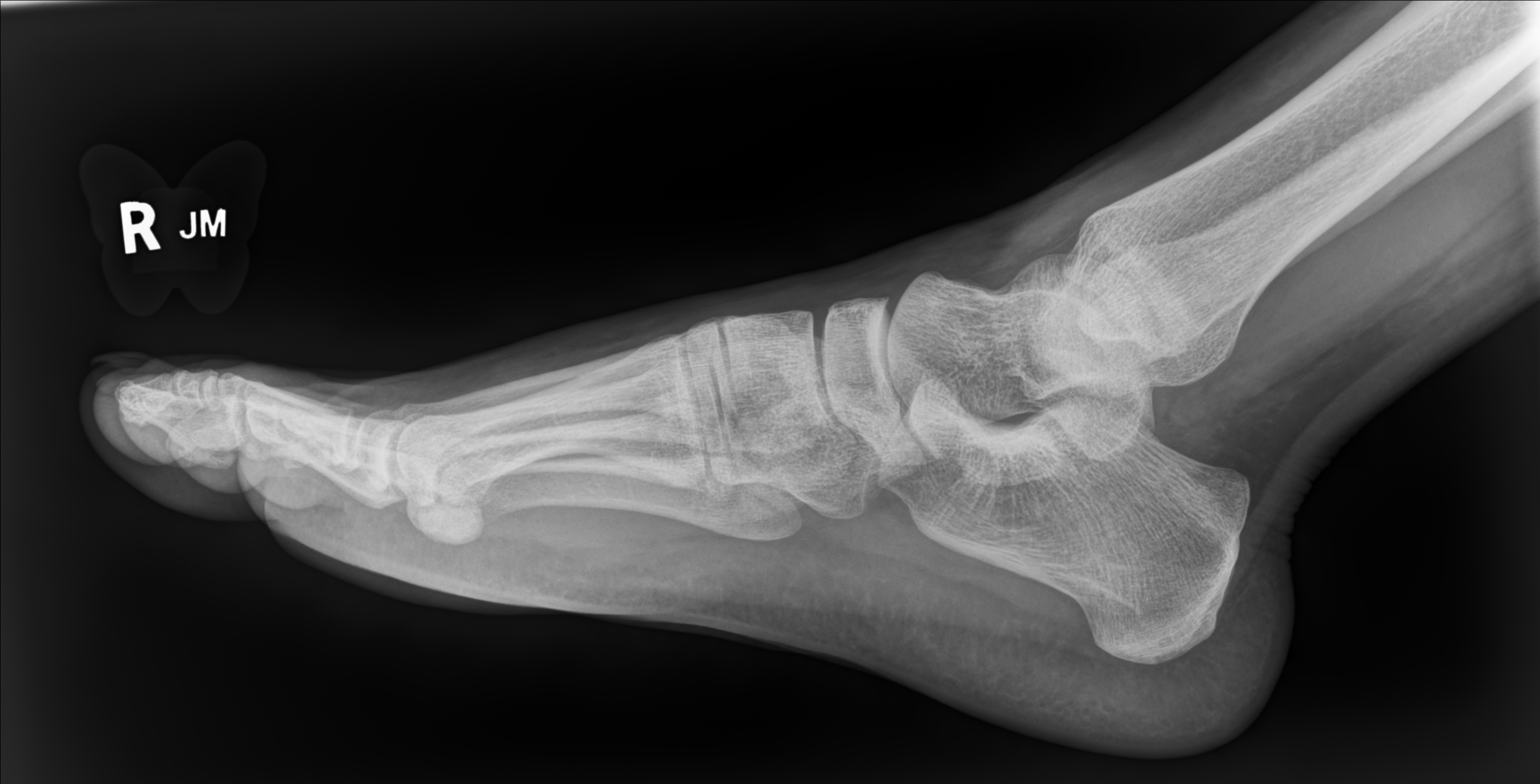

[3 of 3 positions shown; findings below may reference images not displayed]

FINDINGS: No fracture or dislocation. Joint spaces are preserved. No
significant hallux valgus deformity. No erosions. No plantar
calcaneal spur. Regional soft tissues appear normal. No radiopaque
foreign body.
IMPRESSION: No definite explanation for patient's right foot pain.

## 2021-01-15 NOTE — ED Provider Notes (Signed)
[facility]    CSN: [id number] Arrival date & time: 01/15/21  1357      History   Chief Complaint Chief Complaint  Patient presents with   Foot Pain    right    HPI Philip Ramos is a 25 y.o. male.   Patient here today for evaluation of right foot pain that started after an approximately 250 pound flattop grill fell onto his foot at work earlier today.  He reports that he does have pain with walking.  Pain is mostly to the dorsal area of the foot but sometimes will shoot into the ankle.  He reports some numbness and tingling.  He does not report any treatment for symptoms.  The history is provided by the patient.  Foot Pain Pertinent negatives include no shortness of breath.   Past Medical History:  Diagnosis Date   Asthma     There are no problems to display for this patient.   History reviewed. No pertinent surgical history.     Home Medications    Prior to Admission medications   Medication Sig Start Date End Date Taking? Authorizing Provider  albuterol (VENTOLIN HFA) 108 (90 Base) MCG/ACT inhaler Inhale 1-2 puffs into the lungs every 6 (six) hours as needed for wheezing or shortness of breath. 07/15/20   Particia Nearing, PA-C  azithromycin (ZITHROMAX) 250 MG tablet Take 2 tabs PO x 1 dose, then 1 tab PO QD x 4 days 07/20/20   Bing Neighbors, FNP  cetirizine (ZYRTEC) 10 MG tablet Take 1 tablet (10 mg total) by mouth daily. 07/20/20   Bing Neighbors, FNP  predniSONE (DELTASONE) 20 MG tablet Take 2 tablets (40 mg total) by mouth daily with breakfast. 07/15/20   Particia Nearing, PA-C  promethazine-dextromethorphan (PROMETHAZINE-DM) 6.25-15 MG/5ML syrup Take 5 mLs by mouth 4 (four) times daily as needed for cough. 07/15/20   Particia Nearing, PA-C  tiZANidine (ZANAFLEX) 2 MG tablet Take 1-2 tablets (2-4 mg total) by mouth every 6 (six) hours as needed for muscle spasms. 06/03/20   Bing Neighbors, FNP    Family History Family  History  Problem Relation Age of Onset   Hypertension Other    Diabetes Other    Cancer Other    Cancer Mother    Diabetes Mother    Cancer Father    Diabetes Father     Social History Social History   Tobacco Use   Smoking status: Every Day    Packs/day: 1.00    Types: Cigarettes, Cigars   Smokeless tobacco: Never   Tobacco comments:    smokes black and milds  Vaping Use   Vaping Use: Never used  Substance Use Topics   Alcohol use: Yes    Alcohol/week: 1.0 standard drink    Types: 1 Cans of beer per week    Comment: occ   Drug use: No     Allergies   Amoxicillin, Peanut-containing drug products, Penicillins, and Shellfish allergy   Review of Systems Review of Systems  Constitutional:  Negative for chills and fever.  Eyes:  Negative for discharge and redness.  Respiratory:  Negative for shortness of breath.   Musculoskeletal:  Negative for joint swelling.  Skin:  Positive for wound. Negative for color change.  Neurological:  Negative for numbness.    Physical Exam Triage Vital Signs ED Triage Vitals  Enc Vitals Group     BP 01/15/21 1454 115/78     Pulse Rate 01/15/21  1454 62     Resp 01/15/21 1454 18     Temp 01/15/21 1454 (!) 97.5 F (36.4 C)     Temp Source 01/15/21 1454 Oral     SpO2 01/15/21 1454 96 %     Weight --      Height --      Head Circumference --      Peak Flow --      Pain Score 01/15/21 1458 10     Pain Loc --      Pain Edu? --      Excl. in GC? --    No data found.  Updated Vital Signs BP 115/78 (BP Location: Left Arm)   Pulse 62   Temp (!) 97.5 F (36.4 C) (Oral)   Resp 18   SpO2 96%     Physical Exam Vitals and nursing note reviewed.  Constitutional:      General: He is not in acute distress.    Appearance: Normal appearance. He is not ill-appearing.  HENT:     Head: Normocephalic and atraumatic.  Eyes:     Conjunctiva/sclera: Conjunctivae normal.  Cardiovascular:     Rate and Rhythm: Normal rate.  Pulmonary:      Effort: Pulmonary effort is normal.  Musculoskeletal:     Comments: Decreased range of motion of right toes due to pain.  No swelling appreciated to right ankle or foot  Skin:    General: Skin is warm and dry.     Capillary Refill: Normal cap refill to right toes Neurological:     Mental Status: He is alert.     Comments: Gross sensation intact to distal right toes  Psychiatric:        Mood and Affect: Mood normal.        Behavior: Behavior normal.        Thought Content: Thought content normal.     UC Treatments / Results  Labs (all labs ordered are listed, but only abnormal results are displayed) Labs Reviewed - No data to display  EKG   Radiology DG Foot Complete Right  Result Date: 01/15/2021 CLINICAL DATA:  Right foot pain and swelling after injury. EXAM: RIGHT FOOT COMPLETE - 3+ VIEW COMPARISON:  None. FINDINGS: No fracture or dislocation. Joint spaces are preserved. No significant hallux valgus deformity. No erosions. No plantar calcaneal spur. Regional soft tissues appear normal. No radiopaque foreign body. IMPRESSION: No definite explanation for patient's right foot pain. Electronically Signed   By: Simonne Come M.D.   On: 01/15/2021 15:29    Procedures Procedures (including critical care time)  Medications Ordered in UC Medications - No data to display  Initial Impression / Assessment and Plan / UC Course  I have reviewed the triage vital signs and the nursing notes.  Pertinent labs & imaging results that were available during my care of the patient were reviewed by me and considered in my medical decision making (see chart for details).     X-ray without fracture.  Recommended follow-up with HR to determine next steps regarding further follow-up and restrictions.  In the meantime encouraged RICE therapy.  Final Clinical Impressions(s) / UC Diagnoses   Final diagnoses:  Injury of right foot, initial encounter     Discharge Instructions      No  fracture on xray. Recommend RICE therapy. Encourage follow up with ortho if symptoms fail to improve or worsen.      ED Prescriptions   None    PDMP  not reviewed this encounter.   Tomi Bamberger, PA-C 01/16/21 [phone]

## 2021-01-15 NOTE — Discharge Instructions (Signed)
No fracture on xray. Recommend RICE therapy. Encourage follow up with ortho if symptoms fail to improve or worsen.

## 2021-01-15 NOTE — ED Triage Notes (Signed)
Today, while at work an approx 250lbs flat top grill fell on Pt's right foot causing a sudden onset of right foot pain. Pain is the dorsal surface of his foot and toes. Pt notes pain "shoots" into his ankle.  Confirms swelling and bruising with n/t.  No meds taken. No left foot sxs.

## 2021-01-26 ENCOUNTER — Other Ambulatory Visit: Payer: Self-pay

## 2021-01-26 ENCOUNTER — Ambulatory Visit (HOSPITAL_COMMUNITY)
Admission: EM | Admit: 2021-01-26 | Discharge: 2021-01-26 | Disposition: A | Discharge status: Home / Self Care | Payer: Medicare Other | Attending: Emergency Medicine | Admitting: Emergency Medicine

## 2021-01-26 ENCOUNTER — Encounter (HOSPITAL_COMMUNITY): Payer: Self-pay

## 2021-01-26 ENCOUNTER — Ambulatory Visit (INDEPENDENT_AMBULATORY_CARE_PROVIDER_SITE_OTHER): Payer: Medicare Other

## 2021-01-26 DIAGNOSIS — N50812 Left testicular pain: Secondary | ICD-10-CM | POA: Insufficient documentation

## 2021-01-26 DIAGNOSIS — R1032 Left lower quadrant pain: Secondary | ICD-10-CM | POA: Diagnosis not present

## 2021-01-26 DIAGNOSIS — M545 Low back pain, unspecified: Secondary | ICD-10-CM | POA: Diagnosis not present

## 2021-01-26 DIAGNOSIS — R1030 Lower abdominal pain, unspecified: Secondary | ICD-10-CM | POA: Diagnosis not present

## 2021-01-26 LAB — POCT URINALYSIS DIPSTICK, ED / UC
Bilirubin Urine: NEGATIVE
Glucose, UA: NEGATIVE mg/dL
Hgb urine dipstick: NEGATIVE
Nitrite: NEGATIVE
Protein, ur: NEGATIVE mg/dL
Specific Gravity, Urine: >=1.03 (ref 1.005–1.030)
Urobilinogen, UA: 1 mg/dL (ref 0.0–1.0)
pH: 6 (ref 5.0–8.0)

## 2021-01-26 IMAGING — DX DG ABDOMEN 1V
2 series · 2 of 2 positions shown · non-contrast
Comparison: [DATE]

CLINICAL DATA: Lower abdominal pain for 2 days. History of kidney
stones.

EXAM:
ABDOMEN - 1 VIEW

[abdomen kub (1 of 2)]
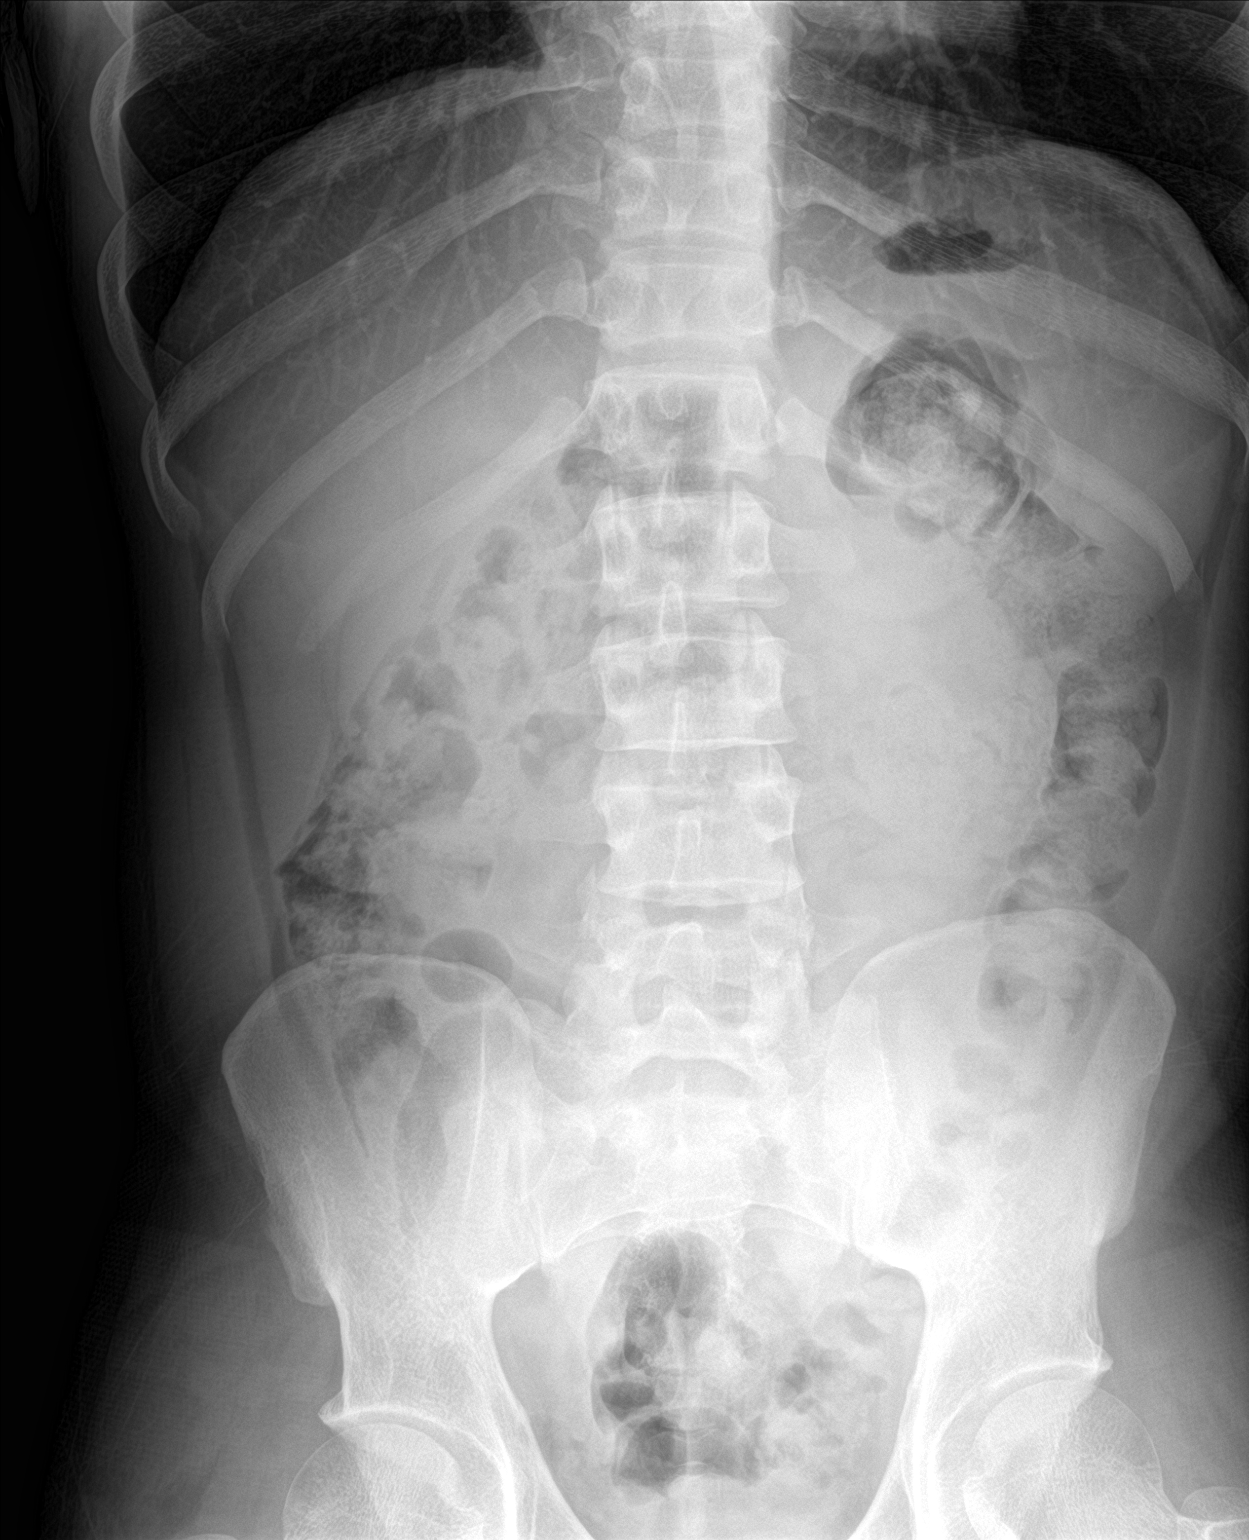

[abdomen kub (2 of 2)]
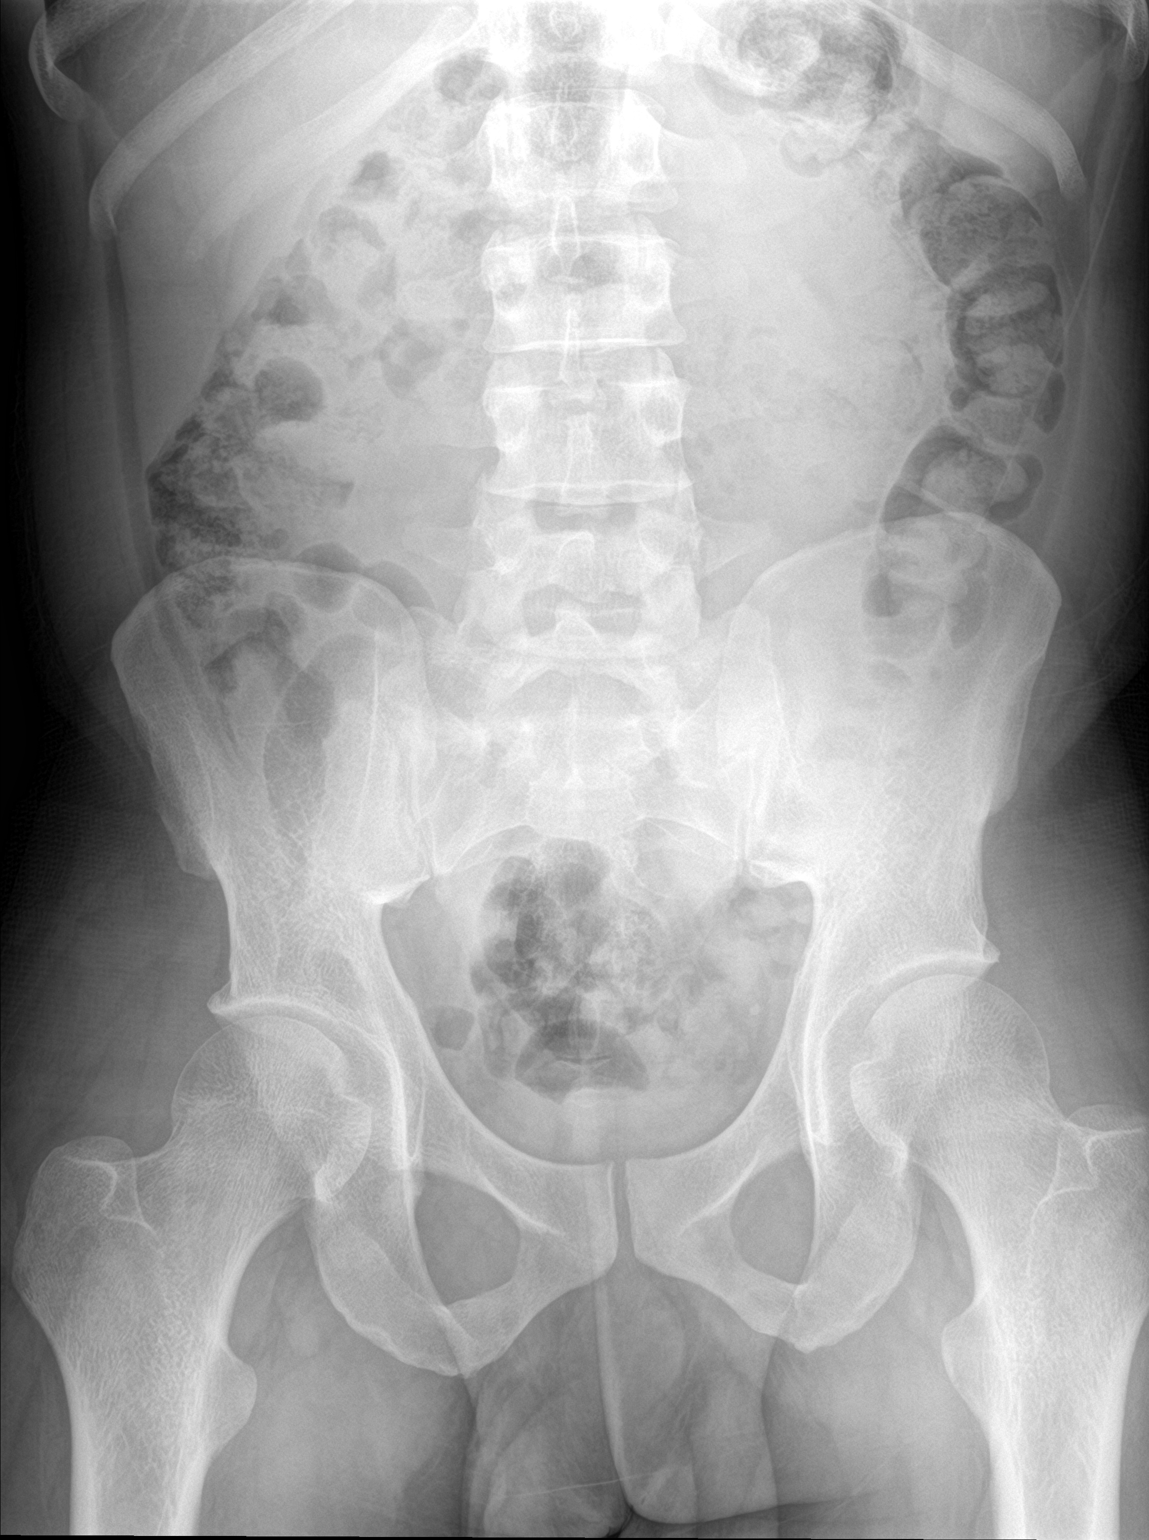

[2 of 2 positions shown; findings below may reference images not displayed]

FINDINGS: No gaseous small bowel dilatation. Air in stool seen scattered along
the length of a nondilated colon. No definite findings to suggest
nephrolithiasis. No stone visualized over the expected course of
either ureter or bladder. Probable phlebolith along the left pelvic
sidewall, stable since prior. Visualized bony anatomy unremarkable.
IMPRESSION: Stable.  No definite evidence of urinary stone disease.

## 2021-01-26 MED ORDER — LEVOFLOXACIN 500 MG PO TABS: 500.0000 mg | ORAL_TABLET | Freq: Every day | ORAL | 0 refills | Status: AC

## 2021-01-26 MED ORDER — TIZANIDINE HCL 4 MG PO TABS: 4.0000 mg | ORAL_TABLET | Freq: Three times a day (TID) | ORAL | 0 refills | Status: DC | PRN

## 2021-01-26 MED ORDER — IBUPROFEN 600 MG PO TABS: 600.0000 mg | ORAL_TABLET | Freq: Four times a day (QID) | ORAL | 0 refills | Status: DC | PRN

## 2021-01-26 NOTE — ED Triage Notes (Signed)
Pt presents with testicular pain and R side back pain x 3 days.

## 2021-01-26 NOTE — ED Provider Notes (Signed)
HPI  SUBJECTIVE:  Philip Ramos is a 25 y.o. male who presents with 2 issues.  First, he reports intermittent, hours long left testicular pain "like someone kicked me in the testicles".  He denies testicular swelling, scrotal erythema, edema, urinary complaints, penile rash, discharge, abdominal pain, fevers, nausea, vomiting, inguinal bulging.  He has never had symptoms like this before.  He is in a long-term monogamous relationship with a male, who is asymptomatic.  STDs are not a concern today.  He has tried ibuprofen and heat.  Heat helps.  Symptoms are worse with sitting, standing up.  He also reports 2 days of right intermittent low back soreness similar to previous nephrolithiasis.  It occurs separately from the testicular pain.  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  This back pain does not migrate or radiate.  No trauma, saddle anesthesia, urinary or fecal incontinence, fevers, urinary retention, leg weakness, bilateral numbness or tingling.  He has been taking ibuprofen 600 mg without improvement in his symptoms.  Symptoms are worse with bending forward.  Is not associated with urination.  He has a past medical history of UTI, nonobstructing nephrolithiasis.  No history of pyelonephritis.  No history of STDs, prostatitis, epididymitis, orchitis.  PMD: None.  Past Medical History:  Diagnosis Date   Asthma     History reviewed. No pertinent surgical history.  Family History  Problem Relation Age of Onset   Hypertension Other    Diabetes Other    Cancer Other    Cancer Mother    Diabetes Mother    Cancer Father    Diabetes Father     Social History   Tobacco Use   Smoking status: Every Day    Packs/day: 1.00    Types: Cigarettes, Cigars   Smokeless tobacco: Never   Tobacco comments:    smokes black and milds  Vaping Use   Vaping Use: Never used  Substance Use Topics   Alcohol use: Yes    Alcohol/week: 1.0 standard drink    Types: 1 Cans of beer per week    Comment:  occ   Drug use: No    No current facility-administered medications for this encounter.  Current Outpatient Medications:    ibuprofen (ADVIL) 600 MG tablet, Take 1 tablet (600 mg total) by mouth every 6 (six) hours as needed., Disp: 30 tablet, Rfl: 0   tiZANidine (ZANAFLEX) 4 MG tablet, Take 1 tablet (4 mg total) by mouth every 8 (eight) hours as needed for muscle spasms., Disp: 30 tablet, Rfl: 0  Allergies  Allergen Reactions   Amoxicillin Swelling    Did it involve swelling of the face/tongue/throat, SOB, or low BP? Y Did it involve sudden or severe rash/hives, skin peeling, or any reaction on the inside of your mouth or nose? Y Did you need to seek medical attention at a hospital or doctor's office? y When did it last happen?      more than 10 years If all above answers are "NO", may proceed with cephalosporin use.    Peanut-Containing Drug Products Swelling   Penicillins Other (See Comments)    Has patient had a PCN reaction causing immediate rash, facial/tongue/throat swelling, SOB or lightheadedness with hypotension: Y Has patient had a PCN reaction causing severe rash involving mucus membranes or skin necrosis: Y Has patient had a PCN reaction that required hospitalization: Y Has patient had a PCN reaction occurring within the last 10 years: Y If all of the above answers are "NO",  then may proceed with Cephalosporin use.    Shellfish Allergy Other (See Comments)    He said that he can eat fish but can not eat shrimp     ROS  As noted in HPI.   Physical Exam  BP 115/70   Pulse 69   Temp 97.9 F (36.6 C) (Oral)   Resp 19   SpO2 98%   Constitutional: Well developed, well nourished, no acute distress Eyes:  EOMI, conjunctiva normal bilaterally HENT: Normocephalic, atraumatic,mucus membranes moist Respiratory: Normal inspiratory effort Cardiovascular: Normal rate GI: nondistended.  Positive left UVJ tenderness.  No suprapubic tenderness.  No other flank  tenderness. GU: Normal circumcised male, testes descended bilaterally.  Normal lie both testicles.  Both testicles nontender.  No testicular swelling, no overlying scrotal swelling, erythema.  Positive mild left epididymal tenderness.  Cremasteric reflex present bilaterally.  No penile rash, discharge.  Positive left inguinal tenderness, no bulging.  No bulging, scrotal swelling seen with Valsalva.  Patient declined chaperone. skin: No rash, skin intact Back: Positive right paralumbar tenderness, spasm.  No L-spine, SI joint bony tenderness.  No CVAT.  Bilateral lower extremities nontender , baseline ROM with intact DP pulses, .No pain with int/ext rotation flex/extension hips bilaterally. SLR neg bilaterally. Sensation intact to light touch bilaterally over both legs, DTR's symmetric and intact bilaterally KJ, Motor symmetric bilateral 5/5 hip flexion, quadriceps, hamstrings, EHL, foot dorsiflexion, foot plantarflexion, gait normal  Musculoskeletal: no deformities Neurologic: Alert & oriented x 3, no focal neuro deficits Psychiatric: Speech and behavior appropriate   ED Course   Medications - No data to display  Orders Placed This Encounter  Procedures   DG Abd 1 View    Standing Status:   Standing    Number of Occurrences:   1    Order Specific Question:   Reason for Exam (SYMPTOM  OR DIAGNOSIS REQUIRED)    Answer:   r/o neprolithaisis   POC Urinalysis dipstick    Standing Status:   Standing    Number of Occurrences:   1    Results for orders placed or performed during the hospital encounter of 01/26/21 (from the past 24 hour(s))  POC Urinalysis dipstick     Status: Abnormal   Collection Time: 01/26/21  9:44 AM  Result Value Ref Range   Glucose, UA NEGATIVE NEGATIVE mg/dL   Bilirubin Urine NEGATIVE NEGATIVE   Ketones, ur TRACE (A) NEGATIVE mg/dL   Specific Gravity, Urine >=1.030 1.005 - 1.030   Hgb urine dipstick NEGATIVE NEGATIVE   pH 6.0 5.0 - 8.0   Protein, ur NEGATIVE  NEGATIVE mg/dL   Urobilinogen, UA 1.0 0.0 - 1.0 mg/dL   Nitrite NEGATIVE NEGATIVE   Leukocytes,Ua TRACE (A) NEGATIVE   DG Abd 1 View  Result Date: 01/26/2021 CLINICAL DATA:  Lower abdominal pain for 2 days. History of kidney stones. EXAM: ABDOMEN - 1 VIEW COMPARISON:  06/03/2020 FINDINGS: No gaseous small bowel dilatation. Air in stool seen scattered along the length of a nondilated colon. No definite findings to suggest nephrolithiasis. No stone visualized over the expected course of either ureter or bladder. Probable phlebolith along the left pelvic sidewall, stable since prior. Visualized bony anatomy unremarkable. IMPRESSION: Stable.  No definite evidence of urinary stone disease. Electronically Signed   By: Kennith Center M.D.   On: 01/26/2021 09:50    ED Clinical Impression  1. Acute right-sided low back pain without sciatica   2. Left testicular pain   3. Left inguinal pain  ED Assessment/Plan  1.  Testicular pain.  Doubt torsion.  In the differential is epididymitis, inguinal hernia, referred pain from nephrolithiasis.  We discussed doing a gonorrhea and chlamydia, patient declined.  I feel that he is low risk enough that this is reasonable.  Checking a UA and a KUB.  UA  very concentrated, trace leukocytes and ketones. Sending urine off for culture.  Reviewed imaging independently.  No obvious nephrolithiasis.  See radiology report for full details.  Suspect epididymitis.  Will send home with Levaquin 500 mg p.o. once daily for 10 days to cover this.  Doubt prostatitis, STI.  This will also cover any possible UTI.  2.  Back pain.  Appears to be very musculoskeletal.  No red flags on history or exam.  Home with Tylenol/ibuprofen, Zanaflex, heat, gentle stretching.  Will provide primary care list and order assistance in finding a PMD.  Strict ER return precautions given.  Discussed labs, imaging, MDM, treatment plan, and plan for follow-up with patient. Discussed sn/sx that  should prompt return to the ED. patient agrees with plan.   Meds ordered this encounter  Medications   ibuprofen (ADVIL) 600 MG tablet    Sig: Take 1 tablet (600 mg total) by mouth every 6 (six) hours as needed.    Dispense:  30 tablet    Refill:  0   tiZANidine (ZANAFLEX) 4 MG tablet    Sig: Take 1 tablet (4 mg total) by mouth every 8 (eight) hours as needed for muscle spasms.    Dispense:  30 tablet    Refill:  0      *This clinic note was created using Scientist, clinical (histocompatibility and immunogenetics). Therefore, there may be occasional mistakes despite careful proofreading.  ?    Domenick Gong, MD 01/26/21 2029

## 2021-01-26 NOTE — Discharge Instructions (Addendum)
I am going to treat you as if you have a epididymal infection.  I suspect this is the cause of your testicular pain.  It could also be a hernia or referred pain from a stone.  I did not see any stone on x-ray.  Finish the antibiotics, even if you feel better.  Take 600 mg of ibuprofen combined with 1000 mg of Tylenol together 3-4 times a day as needed for back pain, heat, Zanaflex for muscle spasm, gentle stretching.  Make sure you drink plenty of extra fluids.  Your urine showed that you are somewhat dehydrated.  Below is a list of primary care practices who are taking new patients for you to follow-up with.  [facility] -multiple locations.  See website at [email or web address]  [facility] internal medicine clinic Ground Floor - [facility], [address] [place], [place], [place] [address] [phone]  [facility] Primary Care at [facility] [address] [place], [place] [address] [phone]  [facility] [address] [place], [place] [address] [phone]  [facility] [phone] [address] [place] [address]  [facility]: [address] [place] [address]  [phone]  [facility]: [address] [place] [address]  [phone]  [facility] : [address] [place] [address] [phone]  [facility] Primary Care: [address] [place] [address] [phone]  [facility]: [address] [place] [address] [phone]  Dr. Oneal Grout [address] [facility] [place] [address]  [phone]  Go to www.goodrx.com  or www.costplusdrugs.com to look up your medications. This will give you a list of where you can find your prescriptions at the  most affordable prices. Or ask the pharmacist what the cash price is, or if they have any other discount programs available to help make your medication more affordable. This can be less expensive than what you would pay with insurance.

## 2021-01-27 LAB — URINE CULTURE: Culture: 10000 — AB

## 2021-10-31 ENCOUNTER — Encounter (HOSPITAL_BASED_OUTPATIENT_CLINIC_OR_DEPARTMENT_OTHER): Payer: Self-pay

## 2021-10-31 ENCOUNTER — Emergency Department (HOSPITAL_BASED_OUTPATIENT_CLINIC_OR_DEPARTMENT_OTHER)
Admission: EM | Admit: 2021-10-31 | Discharge: 2021-10-31 | Disposition: A | Discharge status: Home / Self Care | Payer: Medicare HMO | Attending: Emergency Medicine | Admitting: Emergency Medicine

## 2021-10-31 ENCOUNTER — Other Ambulatory Visit: Payer: Self-pay

## 2021-10-31 ENCOUNTER — Emergency Department (HOSPITAL_BASED_OUTPATIENT_CLINIC_OR_DEPARTMENT_OTHER): Payer: Medicare HMO | Admitting: Radiology

## 2021-10-31 DIAGNOSIS — R109 Unspecified abdominal pain: Secondary | ICD-10-CM | POA: Insufficient documentation

## 2021-10-31 DIAGNOSIS — Z91010 Allergy to peanuts: Secondary | ICD-10-CM | POA: Diagnosis not present

## 2021-10-31 LAB — URINALYSIS, ROUTINE W REFLEX MICROSCOPIC
Bilirubin Urine: NEGATIVE
Glucose, UA: NEGATIVE mg/dL
Hgb urine dipstick: NEGATIVE
Ketones, ur: NEGATIVE mg/dL
Nitrite: NEGATIVE
Protein, ur: NEGATIVE mg/dL
Specific Gravity, Urine: 1.025 (ref 1.005–1.030)
pH: 5.5 (ref 5.0–8.0)

## 2021-10-31 LAB — BASIC METABOLIC PANEL
Anion gap: 9 (ref 5–15)
BUN: 16 mg/dL (ref 6–20)
CO2: 27 mmol/L (ref 22–32)
Calcium: 10.2 mg/dL (ref 8.9–10.3)
Chloride: 105 mmol/L (ref 98–111)
Creatinine, Ser: 1.06 mg/dL (ref 0.61–1.24)
GFR, Estimated: >60 mL/min (ref >60)
Glucose, Bld: 83 mg/dL (ref 70–99)
Potassium: 3.8 mmol/L (ref 3.5–5.1)
Sodium: 141 mmol/L (ref 135–145)

## 2021-10-31 LAB — CBC
HCT: 45.6 % (ref 39.0–52.0)
Hemoglobin: 15.8 g/dL (ref 13.0–17.0)
MCH: 28.5 pg (ref 26.0–34.0)
MCHC: 34.6 g/dL (ref 30.0–36.0)
MCV: 82.3 fL (ref 80.0–100.0)
Platelets: 166 10*3/uL (ref 150–400)
RBC: 5.54 MIL/uL (ref 4.22–5.81)
RDW: 13.4 % (ref 11.5–15.5)
WBC: 4.3 10*3/uL (ref 4.0–10.5)
nRBC: 0 % (ref 0.0–0.2)

## 2021-10-31 LAB — TROPONIN I (HIGH SENSITIVITY): Troponin I (High Sensitivity): 7 ng/L (ref <18)

## 2021-10-31 MED ORDER — KETOROLAC TROMETHAMINE 15 MG/ML IJ SOLN
15.0000 mg | Freq: Once | INTRAMUSCULAR | Status: AC
  Administered 2021-10-31: 15 mg via INTRAMUSCULAR
  Filled 2021-10-31: qty 1

## 2021-10-31 MED ORDER — MELOXICAM 7.5 MG PO TABS
7.5000 mg | ORAL_TABLET | Freq: Every day | ORAL | 0 refills | Status: DC
Start: 2021-10-31 — End: 2022-07-11

## 2021-10-31 NOTE — Discharge Instructions (Signed)
Please read and follow all provided instructions.  Your diagnoses today include:  1. Flank pain     Tests performed today include: Blood cell counts and platelets Kidney and liver function tests Pancreas function test (called lipase) Urine test to look for infection A blood or urine test for pregnancy (women only) Vital signs. See below for your results today.   Medications prescribed:   Take any prescribed medications only as directed.  Home care instructions:  Follow any educational materials contained in this packet.  Follow-up instructions: Please follow-up with your primary care provider in the next 2 days for further evaluation of your symptoms.    Return instructions:  SEEK IMMEDIATE MEDICAL ATTENTION IF: The pain does not go away or becomes severe  A temperature above 101F develops  Repeated vomiting occurs (multiple episodes)  The pain becomes localized to portions of the abdomen. The right side could possibly be appendicitis. In an adult, the left lower portion of the abdomen could be colitis or diverticulitis.  Blood is being passed in stools or vomit (bright red or black tarry stools)  You develop chest pain, difficulty breathing, dizziness or fainting, or become confused, poorly responsive, or inconsolable (young children) If you have any other emergent concerns regarding your health  Additional Information: Abdominal (belly) pain can be caused by many things. Your caregiver performed an examination and possibly ordered blood/urine tests and imaging (CT scan, x-rays, ultrasound). Many cases can be observed and treated at home after initial evaluation in the emergency department. Even though you are being discharged home, abdominal pain can be unpredictable. Therefore, you need a repeated exam if your pain does not resolve, returns, or worsens. Most patients with abdominal pain don't have to be admitted to the hospital or have surgery, but serious problems like  appendicitis and gallbladder attacks can start out as nonspecific pain. Many abdominal conditions cannot be diagnosed in one visit, so follow-up evaluations are very important.  Your vital signs today were: BP 118/64   Pulse (!) 50   Temp 97.6 F (36.4 C)   Resp 18   Ht 5\' 10"  (1.778 m)   Wt 74.8 kg   SpO2 99%   BMI 23.66 kg/m  If your blood pressure (bp) was elevated above 135/85 this visit, please have this repeated by your doctor within one month. --------------

## 2021-10-31 NOTE — ED Triage Notes (Signed)
Patient here POV from Home.  Endorses Right Lateral Torso Pain that began this AM.   N/V/D. No Acute Trauma or Injury.   NAD Noted during Triage. A&Ox4. GCS 15. Ambulatory.

## 2021-10-31 NOTE — ED Provider Notes (Signed)
[facility] EMERGENCY DEPT Provider Note   CSN: [id number] Arrival date & time: 10/31/21  1635     History  Chief Complaint  Patient presents with   Chest Pain    Philip Ramos is a 26 y.o. male.  Patient presents to the emergency department for evaluation of right flank pain that began this morning.  He denies trauma or injuries.  He states that he has had pain similar to this in the past and is told that he could potentially have had a kidney stone (CT 2019 negative for stone).  No dysuria, increased frequency or urgency, or hematuria.  No nausea, vomiting, diarrhea.  No fevers or cough.  No anterior abdominal pain or chest pain.  No difficulty breathing.  No associated diaphoresis.  No testicular swelling or tenderness.      Home Medications Prior to Admission medications   Medication Sig Start Date End Date Taking? Authorizing Provider  ibuprofen (ADVIL) 600 MG tablet Take 1 tablet (600 mg total) by mouth every 6 (six) hours as needed. 01/26/21   Domenick Gong, MD  tiZANidine (ZANAFLEX) 4 MG tablet Take 1 tablet (4 mg total) by mouth every 8 (eight) hours as needed for muscle spasms. 01/26/21   Domenick Gong, MD      Allergies    Amoxicillin, Peanut-containing drug products, Penicillins, and Shellfish allergy    Review of Systems   Review of Systems  Physical Exam Updated Vital Signs BP 118/82   Pulse 60   Temp 97.6 F (36.4 C)   Resp 18   Ht 5\' 10"  (1.778 m)   Wt 74.8 kg   SpO2 100%   BMI 23.66 kg/m   Physical Exam Vitals and nursing note reviewed.  Constitutional:      General: He is not in acute distress.    Appearance: He is well-developed.  HENT:     Head: Normocephalic and atraumatic.  Eyes:     General:        Right eye: No discharge.        Left eye: No discharge.     Conjunctiva/sclera: Conjunctivae normal.  Cardiovascular:     Rate and Rhythm: Normal rate and regular rhythm.     Heart sounds: Normal heart sounds.   Pulmonary:     Effort: Pulmonary effort is normal.     Breath sounds: Normal breath sounds.  Abdominal:     Palpations: Abdomen is soft.     Tenderness: There is no abdominal tenderness.     Comments: Patient without tenderness of the abdomen.  He does not have any focal tenderness over the right flank or lower back.  States that the pain "feels deeper".  Musculoskeletal:     Cervical back: Normal range of motion and neck supple.  Skin:    General: Skin is warm and dry.  Neurological:     Mental Status: He is alert.    ED Results / Procedures / Treatments   Labs (all labs ordered are listed, but only abnormal results are displayed) Labs Reviewed  URINALYSIS, ROUTINE W REFLEX MICROSCOPIC - Abnormal; Notable for the following components:      Result Value   Leukocytes,Ua TRACE (*)    All other components within normal limits  BASIC METABOLIC PANEL  CBC  TROPONIN I (HIGH SENSITIVITY)    ED ECG REPORT   Date: 10/31/2021  Rate: 75  Rhythm: normal sinus rhythm  QRS Axis: right  Intervals: normal  ST/T Wave abnormalities: nonspecific ST/T changes  Conduction Disutrbances:none  Narrative Interpretation:   Old EKG Reviewed: changes noted  I have personally reviewed the EKG tracing and agree with the computerized printout as noted.   Radiology DG Chest 2 View  Result Date: 10/31/2021 CLINICAL DATA:  Right lateral chest pain EXAM: CHEST - 2 VIEW COMPARISON:  05/29/2019 FINDINGS: The heart size and mediastinal contours are within normal limits. Both lungs are clear. The visualized skeletal structures are unremarkable. IMPRESSION: No active cardiopulmonary disease. Electronically Signed   By: Judie Petit.  Shick M.D.   On: 10/31/2021 17:27    Procedures Procedures    Medications Ordered in ED Medications  ketorolac (TORADOL) 15 MG/ML injection 15 mg (15 mg Intramuscular Given 10/31/21 2059)    ED Course/ Medical Decision Making/ A&P    Patient seen and examined. History obtained  directly from patient. Work-up including labs, imaging, EKG ordered in triage, if performed, were reviewed.    Labs/EKG: Independently reviewed and interpreted.  This included: CBC normal; BMP unremarkable; troponin normal.  UA pending.  Imaging: Independently reviewed and interpreted.  This included: Chest x-ray, agree negative.  Medications/Fluids: Ordered: IM toradol.   Most recent vital signs reviewed and are as follows: BP 118/82   Pulse 60   Temp 97.6 F (36.4 C)   Resp 18   Ht 5\' 10"  (1.778 m)   Wt 74.8 kg   SpO2 100%   BMI 23.66 kg/m   Initial impression: Flank pain.  10:21 PM Reassessment performed. Patient appears comfortable.  States that the Toradol did help a bit.  Labs personally reviewed and interpreted including: Reviewed EKG with Dr. , repeat EKG requested. Also UA, normal.  No blood or compelling signs of infection.  ED ECG REPORT   Date: 10/31/2021  Rate: 52  Rhythm: sinus bradycardia  QRS Axis: normal  Intervals: normal  ST/T Wave abnormalities: nonspecific ST/T changes  Conduction Disutrbances:none  Narrative Interpretation:   Old EKG Reviewed: unchanged  I have personally reviewed the EKG tracing and disagree with the computerized printout as noted.  No STEMI, pt has j-point elevation which is consistent with previous EKGs from 2020 and 2021. Also no CP or symptoms consistent with anginal equivalent.   Reviewed pertinent lab work and imaging with patient at bedside. Questions answered.   Most current vital signs reviewed and are as follows: BP 118/64   Pulse (!) 50   Temp 97.6 F (36.4 C)   Resp 18   Ht 5\' 10"  (1.778 m)   Wt 74.8 kg   SpO2 99%   BMI 23.66 kg/m   Plan: Discharge to home.   Prescriptions written for: Meloxicam  Return and follow-up instructions: I encouraged patient to return to ED with severe chest or flank pain, especially if the pain is crushing or pressure-like and spreads to the arms, back, neck, or jaw, or if  they have associated sweating, vomiting, or shortness of breath with the pain, or significant pain with activity.  The patient was urged to return to the Emergency Department immediately with worsening of current symptoms, worsening abdominal pain, persistent vomiting, blood noted in stools, fever, or any other concerns. The patient verbalized understanding.                               Medical Decision Making Amount and/or Complexity of Data Reviewed Labs: ordered. Radiology: ordered.  Risk Prescription drug management.   The following emergent conditions were considered on the differential diagnosis:  acute coronary syndrome, pulmonary embolism, pneumothorax, myocarditis, pericardial tamponade, aortic dissection, thoracic aortic aneurysm complication, esophageal perforation.   Other causes were also considered including: gastroesophageal reflux disease, musculoskeletal pain including costochondritis, pneumonia/pleurisy, herpes zoster, pericarditis.  In regards to possibility of ACS, patient has atypical features of pain, negative troponin, abnormal EKG with j-point elevation but unchanged from preivous. Heart score was calculated to be 1.   In regards to possibility of PE, symptoms are atypical for PE and risk profile is low, patient is PERC negative and chance of PE < 2%.   For this patient's complaint of flank pain, the following conditions were considered on the differential diagnosis: gastritis/PUD, enteritis/duodenitis, appendicitis, cholelithiasis/cholecystitis, cholangitis, pancreatitis, ruptured viscus, colitis, diverticulitis, small/large bowel obstruction, proctitis, cystitis, pyelonephritis, ureteral colic, aortic dissection, aortic aneurysm. Atypical chest etiologies were also considered including ACS, PE, and pneumonia.   The patient's vital signs, pertinent lab work and imaging were reviewed and interpreted as discussed in the ED course. Hospitalization was considered for  further testing, treatments, or serial exams/observation. However as patient is well-appearing, has a stable exam, and reassuring studies today, I do not feel that they warrant admission at this time. This plan was discussed with the patient who verbalizes agreement and comfort with this plan and seems reliable and able to return to the Emergency Department with worsening or changing symptoms.          Final Clinical Impression(s) / ED Diagnoses Final diagnoses:  Flank pain    Rx / DC Orders ED Discharge Orders          Ordered    meloxicam (MOBIC) 7.5 MG tablet  Daily        10/31/21 2216              Renne Crigler, PA-C 10/31/21 2341    Gloris Manchester, MD 11/01/21 [phone]

## 2021-11-11 ENCOUNTER — Other Ambulatory Visit: Payer: Self-pay | Admitting: Family Medicine

## 2021-11-11 DIAGNOSIS — R109 Unspecified abdominal pain: Secondary | ICD-10-CM

## 2021-11-16 ENCOUNTER — Other Ambulatory Visit: Payer: 59

## 2021-11-21 ENCOUNTER — Ambulatory Visit
Admission: RE | Admit: 2021-11-21 | Discharge: 2021-11-21 | Disposition: A | Discharge status: Home / Self Care | Payer: Medicare HMO | Source: Ambulatory Visit | Attending: Family Medicine | Admitting: Family Medicine

## 2021-11-21 DIAGNOSIS — R109 Unspecified abdominal pain: Secondary | ICD-10-CM

## 2022-07-11 ENCOUNTER — Ambulatory Visit
Admission: EM | Admit: 2022-07-11 | Discharge: 2022-07-11 | Disposition: A | Discharge status: Home / Self Care | Payer: Medicare HMO | Attending: Family Medicine | Admitting: Family Medicine

## 2022-07-11 ENCOUNTER — Encounter: Payer: Self-pay | Admitting: Emergency Medicine

## 2022-07-11 ENCOUNTER — Other Ambulatory Visit: Payer: Self-pay

## 2022-07-11 DIAGNOSIS — M545 Low back pain, unspecified: Secondary | ICD-10-CM

## 2022-07-11 DIAGNOSIS — R300 Dysuria: Secondary | ICD-10-CM

## 2022-07-11 DIAGNOSIS — G8929 Other chronic pain: Secondary | ICD-10-CM | POA: Diagnosis present

## 2022-07-11 LAB — POCT URINALYSIS DIP (MANUAL ENTRY)
Bilirubin, UA: NEGATIVE
Blood, UA: NEGATIVE
Glucose, UA: NEGATIVE mg/dL
Ketones, POC UA: NEGATIVE mg/dL
Leukocytes, UA: NEGATIVE
Nitrite, UA: NEGATIVE
Protein Ur, POC: NEGATIVE mg/dL
Spec Grav, UA: 1.025 (ref 1.010–1.025)
Urobilinogen, UA: 1 E.U./dL
pH, UA: 6 (ref 5.0–8.0)

## 2022-07-11 MED ORDER — IBUPROFEN 600 MG PO TABS: 600.0000 mg | ORAL_TABLET | Freq: Four times a day (QID) | ORAL | 0 refills | Status: AC | PRN

## 2022-07-11 MED ORDER — CYCLOBENZAPRINE HCL 10 MG PO TABS: 5.0000 mg | ORAL_TABLET | Freq: Two times a day (BID) | ORAL | 0 refills | Status: AC | PRN

## 2022-07-11 NOTE — ED Triage Notes (Signed)
Pt here for lower back pain and some dysuria x 1 week

## 2022-07-11 NOTE — Discharge Instructions (Signed)
You were seen today for back pain and painful urination.  Your urine here appears normal.  Unlikely a kidney stone.  I have sent out an anti-inflammatory and muscle relaxer to help for your back pain.  The muscle relaxer could make you tired/sleepy so please take when home and not driving.  You may use a heating pad as well.  I have done a urine culture and swab in regards to the painful urination.  These will be resulted over the next several days and if treatment is needed we will notify you.  Please increase fluid intake to help.

## 2022-07-11 NOTE — ED Provider Notes (Signed)
[facility]    CSN: [id number] Arrival date & time: 07/11/22  0865      History   Chief Complaint Chief Complaint  Patient presents with   Back Pain   Dysuria    HPI Philip Ramos is a 27 y.o. male.   Patient is here for low back pain x dysuria x 1 week.  He has h/o mild kidney stones in the past, and this feels similar.  He has been taking motrin for back pain.  The painful urination is new, comes and goes.  No penile d/c or irritation.  No risk for stds.  Active with 1 partner, no condoms.  He admits to not drinking enough water.  Pain if he doesn't drink that much.  If he drinks more water it is helpful.        Past Medical History:  Diagnosis Date   Asthma     There are no problems to display for this patient.   History reviewed. No pertinent surgical history.     Home Medications    Prior to Admission medications   Medication Sig Start Date End Date Taking? Authorizing Provider  meloxicam (MOBIC) 7.5 MG tablet Take 1 tablet (7.5 mg total) by mouth daily. Patient not taking: Reported on 07/11/2022 10/31/21   Renne Crigler, PA-C    Family History Family History  Problem Relation Age of Onset   Hypertension Other    Diabetes Other    Cancer Other    Cancer Mother    Diabetes Mother    Cancer Father    Diabetes Father     Social History Social History   Tobacco Use   Smoking status: Every Day    Packs/day: .1    Types: Cigarettes, Cigars   Smokeless tobacco: Never   Tobacco comments:    smokes black and milds  Vaping Use   Vaping Use: Never used  Substance Use Topics   Alcohol use: Yes    Alcohol/week: 1.0 standard drink of alcohol    Types: 1 Cans of beer per week    Comment: occ   Drug use: No     Allergies   Amoxicillin, Peanut-containing drug products, Penicillins, and Shellfish allergy   Review of Systems Review of Systems  Constitutional: Negative.   HENT: Negative.    Respiratory: Negative.     Cardiovascular: Negative.   Gastrointestinal: Negative.   Genitourinary:  Positive for dysuria.  Musculoskeletal:  Positive for back pain.  Psychiatric/Behavioral: Negative.       Physical Exam Triage Vital Signs ED Triage Vitals [07/11/22 0912]  Enc Vitals Group     BP 108/69     Pulse Rate 62     Resp 18     Temp 97.9 F (36.6 C)     Temp Source Oral     SpO2 95 %     Weight      Height      Head Circumference      Peak Flow      Pain Score 2     Pain Loc      Pain Edu?      Excl. in GC?    No data found.  Updated Vital Signs BP 108/69 (BP Location: Left Arm)   Pulse 62   Temp 97.9 F (36.6 C) (Oral)   Resp 18   SpO2 95%   Visual Acuity Right Eye Distance:   Left Eye Distance:   Bilateral Distance:    Right  Eye Near:   Left Eye Near:    Bilateral Near:     Physical Exam Constitutional:      Appearance: Normal appearance.  Cardiovascular:     Rate and Rhythm: Normal rate and regular rhythm.  Pulmonary:     Effort: Pulmonary effort is normal.     Breath sounds: Normal breath sounds.  Abdominal:     Palpations: Abdomen is soft.     Tenderness: There is no abdominal tenderness. There is no right CVA tenderness, left CVA tenderness, guarding or rebound.  Musculoskeletal:     Comments: No spinous tenderness;  mildly tender at the right low back/paraspinals with light palpation;    Neurological:     General: No focal deficit present.     Mental Status: He is alert.  Psychiatric:        Mood and Affect: Mood normal.      UC Treatments / Results  Labs (all labs ordered are listed, but only abnormal results are displayed) Labs Reviewed  URINE CULTURE  POCT URINALYSIS DIP (MANUAL ENTRY)  CYTOLOGY, (ORAL, ANAL, URETHRAL) ANCILLARY ONLY    EKG   Radiology No results found.  Procedures Procedures (including critical care time)  Medications Ordered in UC Medications - No data to display  Initial Impression / Assessment and Plan / UC  Course  I have reviewed the triage vital signs and the nursing notes.  Pertinent labs & imaging results that were available during my care of the patient were reviewed by me and considered in my medical decision making (see chart for details).  Final Clinical Impressions(s) / UC Diagnoses   Final diagnoses:  Dysuria  Chronic right-sided low back pain without sciatica     Discharge Instructions      You were seen today for back pain and painful urination.  Your urine here appears normal.  Unlikely a kidney stone.  I have sent out an anti-inflammatory and muscle relaxer to help for your back pain.  The muscle relaxer could make you tired/sleepy so please take when home and not driving.  You may use a heating pad as well.  I have done a urine culture and swab in regards to the painful urination.  These will be resulted over the next several days and if treatment is needed we will notify you.  Please increase fluid intake to help.     ED Prescriptions     Medication Sig Dispense Auth. Provider   ibuprofen (ADVIL) 600 MG tablet Take 1 tablet (600 mg total) by mouth every 6 (six) hours as needed. 30 tablet Javiel Canepa, MD   cyclobenzaprine (FLEXERIL) 10 MG tablet Take 0.5-1 tablets (5-10 mg total) by mouth 2 (two) times daily as needed for muscle spasms. 20 tablet Jannifer Franklin, MD      PDMP not reviewed this encounter.   Jannifer Franklin, MD 07/11/22 [phone]

## 2022-07-12 LAB — CYTOLOGY, (ORAL, ANAL, URETHRAL) ANCILLARY ONLY
Chlamydia: NEGATIVE
Comment: NEGATIVE
Comment: NEGATIVE
Comment: NORMAL
Neisseria Gonorrhea: POSITIVE — AB
Trichomonas: POSITIVE — AB

## 2022-07-12 LAB — URINE CULTURE: Culture: NO GROWTH

## 2022-07-13 ENCOUNTER — Ambulatory Visit
Admission: EM | Admit: 2022-07-13 | Discharge: 2022-07-13 | Disposition: A | Discharge status: Home / Self Care | Payer: Medicare HMO | Attending: Internal Medicine | Admitting: Internal Medicine

## 2022-07-13 ENCOUNTER — Telehealth (HOSPITAL_COMMUNITY): Payer: Self-pay | Admitting: Emergency Medicine

## 2022-07-13 DIAGNOSIS — A549 Gonococcal infection, unspecified: Secondary | ICD-10-CM | POA: Diagnosis not present

## 2022-07-13 MED ORDER — METRONIDAZOLE 500 MG PO TABS: 2000.0000 mg | ORAL_TABLET | Freq: Once | ORAL | 0 refills | Status: DC

## 2022-07-13 MED ORDER — AZITHROMYCIN 500 MG PO TABS
2000.0000 mg | ORAL_TABLET | Freq: Once | ORAL | Status: AC
  Administered 2022-07-13: 2000 mg via ORAL

## 2022-07-13 MED ORDER — GENTAMICIN SULFATE 40 MG/ML IJ SOLN
240.0000 mg | Freq: Once | INTRAMUSCULAR | Status: AC
  Administered 2022-07-13: 240 mg via INTRAMUSCULAR

## 2022-07-13 MED ORDER — METRONIDAZOLE 500 MG PO TABS: 2000.0000 mg | ORAL_TABLET | Freq: Once | ORAL | 0 refills | Status: AC

## 2022-07-13 NOTE — ED Triage Notes (Signed)
Pt here for antibiotics for STD.

## 2022-07-13 NOTE — Telephone Encounter (Signed)
Per protocol, patient will need treatment with IM Rocephin 500mg  for positive Gonorrhea.  Will also need treatment with Flagyl.  Attempted to reach patient x 1, LVM Prescription sent to pharmacy on file [facility] notified

## 2022-07-13 NOTE — ED Provider Notes (Signed)
Patient here today for treatment for gonorrhea.  He was already sent metronidazole for trichomonas.  Patient is allergic to penicillin and it causes facial and tongue swelling so will defer IM Rocephin as he is not sure if he has ever taken this before.  Therefore, alternative treatment is azithromycin 2000 mg as a single dose and gentamicin injection.  This was administered today.   Philip Ramos, [place] 07/13/22 [phone]

## 2023-03-26 ENCOUNTER — Telehealth: Payer: Self-pay

## 2023-03-26 NOTE — Telephone Encounter (Signed)
Patient called in - DOB/Address verified - requested appointment to be retested for Food Allergies.  Scheduled patient for 04/05/23 @ 9:30 am w/Dr.Padgett - [place] Office.  Patient verbalized understanding to all, no further questions.  New Patient Packet mailed to resident.

## 2023-04-05 ENCOUNTER — Ambulatory Visit (INDEPENDENT_AMBULATORY_CARE_PROVIDER_SITE_OTHER): Payer: Medicare HMO | Admitting: Allergy

## 2023-04-05 ENCOUNTER — Encounter: Payer: Self-pay | Admitting: Allergy

## 2023-04-05 ENCOUNTER — Other Ambulatory Visit: Payer: Self-pay

## 2023-04-05 VITALS — BP 112/68 | HR 67 | Temp 98.5°F | Resp 15 | Ht 67.75 in | Wt 171.7 lb

## 2023-04-05 DIAGNOSIS — Z720 Tobacco use: Secondary | ICD-10-CM

## 2023-04-05 DIAGNOSIS — L500 Allergic urticaria: Secondary | ICD-10-CM | POA: Diagnosis not present

## 2023-04-05 DIAGNOSIS — J310 Chronic rhinitis: Secondary | ICD-10-CM

## 2023-04-05 DIAGNOSIS — J4520 Mild intermittent asthma, uncomplicated: Secondary | ICD-10-CM

## 2023-04-05 MED ORDER — ALBUTEROL SULFATE HFA 108 (90 BASE) MCG/ACT IN AERS
2.0000 | INHALATION_SPRAY | RESPIRATORY_TRACT | 1 refills | Status: AC | PRN
Start: 2023-04-05 — End: ?

## 2023-04-05 NOTE — Patient Instructions (Addendum)
Allergic hives History of hives in childhood, but unclear if hives were related to food ingestion. Patient has been avoiding nuts and shellfish due to concern for food allergy since childhood and is interested in reintroducing them if not allergic.   -Order blood work to test for IgE antibodies to nuts and shellfish.   -If results are negative or low, schedule for skin testing and potential food challenge in the office.  Food challenge if able to perform will confirm if you are or are not allergic to nuts or shellfish.  -If testing is positive then will prescribe you an epinephrine device to have access to in case of allergic reactions   Seasonal Allergies   Reports symptoms of runny nose, stuffy nose, and itchy eyes during spring and fall. Symptoms exacerbated by grass cutting.   -Can use antihistamine like Zyrtc, Allegra or Xyzal daily as needed -For nasal congestion can use nasal steroid like Flonase, Rhinocort, Nasacort when needed -For itchy/watery eyes symptoms can use Pataday 1 drop each eye daily as needed   Asthma   History of asthma in childhood and adulthood, but no current inhaler use. Reports occasional shortness of breath with smoking and cutting grass   -Have access to albuterol inhaler 2 puffs every 4-6 hours as needed for cough/wheeze/shortness of breath/chest tightness.  May use 15-20 minutes prior to activity.   Monitor frequency of use.    Smoking   Current smoker, but has reduced smoking since the birth of a child three weeks ago. Expressed interest in quitting.   -Encourage continued efforts to quit smoking for overall health improvement.  Discuss smoking cessation with your PCP  Follow-up pending labwork

## 2023-04-05 NOTE — Progress Notes (Signed)
New Patient Note  RE: [name] MRN: [id number] DOB: 06-18-1995 Date of Office Visit: 04/05/2023  Primary care provider: Pcp, No  Chief Complaint: food allergy  History of present illness: Philip Ramos is a 28 y.o. male presenting today for evaluation of food allergy. Discussed the use of AI scribe software for clinical note transcription with the patient, who gave verbal consent to proceed.  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 He has avoided these foods since then but is interested in determining if he is still allergic, as he would like to consume them if possible. He recalls experiencing hives at night during childhood, not always associated with food intake, and describes an incident where he consumed a large quantity of peanuts and developed hives, though he also experienced hives without known food triggers.  Thus he is wondering if he was truly allergic to these foods or if he was just having spontaneous hives.  He has a history of asthma since childhood, which continues into adulthood. He does not currently use an albuterol inhaler but sometimes experiences breathing difficulties, particularly when smoking. He is a smoker but has reduced smoking significantly since the birth of his child three weeks ago, now smoking outside but due to the cold weather he has not been in sinus he has cut back some.  He reports a history of seasonal allergies with symptoms such as runny nose, stuffy nose, and itchy eyes, worsening during spring and fall. He has used allergy medications like Benadryl or Claritin, especially when exposed to grass while mowing the lawn. He mentions using an inhaler in the past during these times of cutting the grass as well.      Review of systems: 10pt ROS negative unless noted above in HPI  All other systems negative unless noted above in HPI  Past medical history: Past  Medical History:  Diagnosis Date   Asthma    Eczema    Urticaria     Past surgical history: History reviewed. No pertinent surgical history.  Family history:  Family History  Problem Relation Age of Onset   Hypertension Other    Diabetes Other    Cancer Other    Cancer Mother    Diabetes Mother    Cancer Father    Diabetes Father     Social history: Lives in a apartment with electric heating and central cooling.  No pets in the home.  No concern for water damage, mildew or roaches in the home.  He is a Estate agent.    Tobacco Use   Smoking status: Every Day    Current packs/day: 0.10    Types: Cigarettes, Cigars   Smokeless tobacco: Never   Tobacco comments:    smokes black and milds  Vaping Use   Vaping status: Never Used     Medication List: Current Outpatient Medications  Medication Sig Dispense Refill   ibuprofen (ADVIL) 600 MG tablet Take 1 tablet (600 mg total) by mouth every 6 (six) hours as needed. 30 tablet 0   cyclobenzaprine (FLEXERIL) 10 MG tablet Take 0.5-1 tablets (5-10 mg total) by mouth 2 (two) times daily as needed for muscle spasms. (Patient not taking: Reported on 04/05/2023) 20 tablet 0   No current facility-administered medications for this visit.    Known medication allergies: Allergies  Allergen Reactions   Amoxicillin Swelling    Did it involve swelling of the face/tongue/throat, SOB, or  low BP? Y Did it involve sudden or severe rash/hives, skin peeling, or any reaction on the inside of your mouth or nose? Y Did you need to seek medical attention at a hospital or doctor's office? y When did it last happen?      more than 10 years If all above answers are "NO", may proceed with cephalosporin use.    Peanut-Containing Drug Products Swelling   Penicillins Other (See Comments)    Has patient had a PCN reaction causing immediate rash, facial/tongue/throat swelling, SOB or lightheadedness with hypotension: Y Has patient had a PCN  reaction causing severe rash involving mucus membranes or skin necrosis: Y Has patient had a PCN reaction that required hospitalization: Y Has patient had a PCN reaction occurring within the last 10 years: Y If all of the above answers are "NO", then may proceed with Cephalosporin use.    Shellfish Allergy Other (See Comments)    He said that he can eat fish but can not eat shrimp     Physical examination: Blood pressure 112/68, pulse 67, temperature 98.5 F (36.9 C), temperature source Temporal, resp. rate 15, height 5' 7.75" (1.721 m), weight 171 lb 11.2 oz (77.9 kg), SpO2 96%.  General: Alert, interactive, in no acute distress. HEENT: PERRLA, TMs pearly gray, turbinates moderately edematous without discharge, post-pharynx non erythematous. Neck: Supple without lymphadenopathy. Lungs: Clear to auscultation without wheezing, rhonchi or rales. {no increased work of breathing. CV: Normal S1, S2 without murmurs. Abdomen: Nondistended, nontender. Skin: Warm and dry, without lesions or rashes. Extremities:  No clubbing, cyanosis or edema. Neuro:   Grossly intact.  Diagnositics/Labs: None today  Assessment and plan: Allergic urticaria History of hives in childhood, but unclear if hives were related to food ingestion. Patient has been avoiding nuts and shellfish due to concern for food allergy since childhood and is interested in reintroducing them if not allergic.   -Order blood work to test for IgE antibodies to nuts and shellfish.   -If results are negative or low, schedule for skin testing and potential food challenge in the office.  Food challenge if able to perform will confirm if you are or are not allergic to nuts or shellfish.  -If testing is positive then will prescribe you an epinephrine device to have access to in case of allergic reactions   Rhinoconjunctivitis, presumed allergic Reports symptoms of runny nose, stuffy nose, and itchy eyes during spring and fall. Symptoms  exacerbated by grass cutting.   -Can use antihistamine like Zyrtc, Allegra or Xyzal daily as needed -For nasal congestion can use nasal steroid like Flonase, Rhinocort, Nasacort when needed -For itchy/watery eyes symptoms can use Pataday 1 drop each eye daily as needed   Asthma, mild intermittent History of asthma in childhood and adulthood, but no current inhaler use. Reports occasional shortness of breath with smoking and cutting grass   -Have access to albuterol inhaler 2 puffs every 4-6 hours as needed for cough/wheeze/shortness of breath/chest tightness.  May use 15-20 minutes prior to activity.   Monitor frequency of use.    Tobacco use Current smoker, but has reduced smoking since the birth of a child three weeks ago. Expressed interest in quitting.   -Encourage continued efforts to quit smoking for overall health improvement.  Discuss smoking cessation with your PCP  Follow-up pending labwork  I appreciate the opportunity to take part in Delvis's care. Please do not hesitate to contact me with questions.  Sincerely,   Margo Aye, MD Allergy/Immunology [facility]

## 2023-04-08 LAB — IGE NUT PROF. W/COMPONENT RFLX

## 2023-04-09 ENCOUNTER — Telehealth: Payer: Self-pay | Admitting: Allergy

## 2023-04-09 NOTE — Telephone Encounter (Signed)
 Philip Ramos called in and states he received part of his labs and saw his labs on MyChart and would like someone to call him about the ones that came in already.  I informed him there are others still pending and did he want to wait until they all came in.  Philip Ramos stated yes he wants to discuss what he is allergic to based on his MyChart labs because he noticed a lot of exclamation points.

## 2023-04-09 NOTE — Telephone Encounter (Signed)
 Philip Ramos is also asking about getting skin testing.  He wants to know how long he has to wait to get skin testing after labs.

## 2023-04-10 ENCOUNTER — Encounter: Payer: Self-pay | Admitting: Allergy

## 2023-04-10 LAB — IGE NUT PROF. W/COMPONENT RFLX
F017-IgE Hazelnut (Filbert): 1.78 kU/L — AB
F203-IgE Pistachio Nut: 0.11 kU/L — AB
Macadamia Nut, IgE: 0.22 kU/L — AB
Peanut, IgE: 0.14 kU/L — AB
Peanut, IgE: 0.39 kU/L — AB

## 2023-04-10 LAB — ALLERGENS W/TOTAL IGE AREA 2
Alternaria Alternata IgE: <0.1 kU/L
Aspergillus Fumigatus IgE: <0.1 kU/L
Bermuda Grass IgE: 69.2 kU/L — AB
Cat Dander IgE: 0.1 kU/L — AB
Cedar, Mountain IgE: 20.6 kU/L — AB
Cladosporium Herbarum IgE: <0.1 kU/L
Cockroach, German IgE: 0.19 kU/L — AB
Common Silver Birch IgE: 1.81 kU/L — AB
Cottonwood IgE: <0.1 kU/L
D Farinae IgE: 1.56 kU/L — AB
D Pteronyssinus IgE: 1.74 kU/L — AB
Dog Dander IgE: <0.1 kU/L
Elm, American IgE: 0.1 kU/L — AB
IgE (Immunoglobulin E), Serum: 252 [IU]/mL (ref 6–495)
Johnson Grass IgE: 30.1 kU/L — AB
Maple/Box Elder IgE: 1.61 kU/L — AB
Mouse Urine IgE: <0.1 kU/L
Oak, White IgE: 2.66 kU/L — AB
Pecan, Hickory IgE: 0.13 kU/L — AB
Penicillium Chrysogen IgE: <0.1 kU/L
Pigweed, Rough IgE: <0.1 kU/L
Ragweed, Short IgE: 0.74 kU/L — AB
Sheep Sorrel IgE Qn: <0.1 kU/L
Timothy Grass IgE: 39.6 kU/L — AB
White Mulberry IgE: <0.1 kU/L

## 2023-04-10 LAB — ALLERGEN PROFILE, SHELLFISH
Clam IgE: 0.11 kU/L — AB
F023-IgE Crab: <0.1 kU/L
F080-IgE Lobster: <0.1 kU/L
F290-IgE Oyster: <0.1 kU/L
Scallop IgE: 0.15 kU/L — AB
Shrimp IgE: 0.17 kU/L — AB

## 2023-04-10 LAB — PEANUT COMPONENTS
F352-IgE Ara h 8: 0.68 kU/L — AB
F422-IgE Ara h 1: <0.1 kU/L
F423-IgE Ara h 2: <0.1 kU/L
F424-IgE Ara h 3: <0.1 kU/L
F427-IgE Ara h 9: <0.1 kU/L
F447-IgE Ara h 6: <0.1 kU/L

## 2023-04-10 LAB — PANEL 604726
Cor A 1 IgE: 3.38 kU/L — AB
Cor A 14 IgE: <0.1 kU/L
Cor A 8 IgE: <0.1 kU/L
Cor A 9 IgE: <0.1 kU/L

## 2023-04-10 LAB — ALLERGEN COMPONENT COMMENTS

## 2023-04-10 NOTE — Telephone Encounter (Signed)
 Per Provider:  Please let pt know the following:  -nut IgE panel shows low IgE to peanut, pistachio, almond, macadamia nut and moderately low levels to hazelnut. -shellfish panel shows low IgE to shrimp, clam and scallop -Environmental allergy panel shows very high IgE to grass pollen, tree pollen; high IgE to dust mites; moderate IgE to ragweed; low IgE to cat dander, cockroach.  Provide with avoidance measures   With his shellfish and nut panel being low would recommend he schedule for skin testing to shellfish and nuts and this will determine if he is eligible for an office food challenge.  Called patient - DOB verified - advised/reviewed above lab results/provider notation.  Scheduled patient for skin testing: 04/17/23 Tuesday @ 9 am w/Chrissie.  Patient verbalized understanding to all no further questions.

## 2023-04-17 ENCOUNTER — Ambulatory Visit: Payer: Medicare HMO | Admitting: Family

## 2023-05-04 NOTE — Progress Notes (Deleted)
   [address] [place] [address] Dept: [phone]  FOLLOW UP NOTE  Patient ID: ANSELMO REIHL, male    DOB: [birth year]  Age: 28 y.o. MRN: [id number] Date of Office Visit: 05/07/2023  Assessment  Chief Complaint: No chief complaint on file.  HPI GIOVANNI BIBY is a 28 year old male who presents to the clinic for follow-up visit.  He was last seen in this clinic on 04/05/2023 by Dr. Delorse Lek as a new patient for evaluation of asthma, allergic rhinitis, urticaria, food allergy to peanut, tree nut, shellfish, and tobacco use.  His last environmental allergy testing by lab was on 04/05/2023 and was positive to  His last food allergy testing by lab was on 04/05/2023 and was noted to be low positive to peanuts, tree nuts, and shellfish.     Discussed the use of AI scribe software for clinical note transcription with the patient, who gave verbal consent to proceed.  History of Present Illness             Drug Allergies:  Allergies  Allergen Reactions   Amoxicillin Swelling    Did it involve swelling of the face/tongue/throat, SOB, or low BP? Y Did it involve sudden or severe rash/hives, skin peeling, or any reaction on the inside of your mouth or nose? Y Did you need to seek medical attention at a hospital or doctor's office? y When did it last happen?      more than 10 years If all above answers are "NO", may proceed with cephalosporin use.    Peanut-Containing Drug Products Swelling   Penicillins Other (See Comments)    Has patient had a PCN reaction causing immediate rash, facial/tongue/throat swelling, SOB or lightheadedness with hypotension: Y Has patient had a PCN reaction causing severe rash involving mucus membranes or skin necrosis: Y Has patient had a PCN reaction that required hospitalization: Y Has patient had a PCN reaction occurring within the last 10 years: Y If all of the above answers are "NO", then may proceed with Cephalosporin use.    Shellfish  Allergy Other (See Comments)    He said that he can eat fish but can not eat shrimp    Physical Exam: There were no vitals taken for this visit.   Physical Exam  Diagnostics:    Assessment and Plan: No diagnosis found.  No orders of the defined types were placed in this encounter.   There are no Patient Instructions on file for this visit.  No follow-ups on file.    Thank you for the opportunity to care for this patient.  Please do not hesitate to contact me with questions.  Thermon Leyland, FNP [facility]

## 2023-05-07 ENCOUNTER — Ambulatory Visit: Payer: Medicare HMO | Admitting: Family Medicine

## 2023-09-25 ENCOUNTER — Other Ambulatory Visit: Payer: Self-pay

## 2023-09-25 ENCOUNTER — Encounter: Payer: Self-pay | Admitting: Emergency Medicine

## 2023-09-25 ENCOUNTER — Ambulatory Visit
Admission: EM | Admit: 2023-09-25 | Discharge: 2023-09-25 | Disposition: A | Discharge status: Home / Self Care | Attending: Family Medicine | Admitting: Family Medicine

## 2023-09-25 DIAGNOSIS — M5441 Lumbago with sciatica, right side: Secondary | ICD-10-CM

## 2023-09-25 MED ORDER — METHYLPREDNISOLONE 4 MG PO TBPK: ORAL_TABLET | ORAL | 0 refills | Status: AC

## 2023-09-25 MED ORDER — HYDROCODONE-ACETAMINOPHEN 5-325 MG PO TABS: 1.0000 | ORAL_TABLET | Freq: Four times a day (QID) | ORAL | 0 refills | Status: AC | PRN

## 2023-09-25 NOTE — Discharge Instructions (Addendum)
 Be aware, you have been prescribed pain medications that may cause drowsiness. While taking this medication, do not take any other medications containing acetaminophen  (Tylenol ). Do not combine with alcohol or recreational drugs. Please do not drive, operate heavy machinery, or take part in activities that require making important decisions while on this medication as your judgement may be clouded.  HOME CARE INSTRUCTIONS: For many people, back pain returns. Since low back pain is rarely dangerous, it is often a condition that people can learn to manage on their own. Please remain active. It is stressful on the back to sit or stand in one place. Do not sit, drive, or stand in one place for more than 30 minutes at a time. Take short walks on level surfaces as soon as pain allows. Try to increase the length of time you walk each day. Do not stay in bed. Resting more than 1 or 2 days can delay your recovery. Do not avoid exercise or work. Your body is made to move. It is not dangerous to be active, even though your back may hurt. Your back will likely heal faster if you return to being active before your pain is gone. Over-the-counter medicines to reduce pain and inflammation are often the most helpful.  SEEK MEDICAL CARE IF: You have pain that is not relieved with rest or medicine. You have pain that does not improve in 1 week. You have new symptoms. You are generally not feeling well.  SEEK IMMEDIATE MEDICAL CARE IF: You have pain that radiates from your back into your legs. You develop new bowel or bladder control problems. You have unusual weakness or numbness in your arms or legs. You develop nausea or vomiting. You develop abdominal pain. You feel faint.

## 2023-09-25 NOTE — ED Provider Notes (Signed)
 [facility]   [id number] 09/25/23 Arrival Time: 1053  ASSESSMENT & PLAN:  1. Acute right-sided low back pain with right-sided sciatica    Able to ambulate here and hemodynamically stable. No indication for imaging of back at this time given no trauma and normal neurological exam. Discussed.  Meds ordered this encounter  Medications   methylPREDNISolone  (MEDROL  DOSEPAK) 4 MG TBPK tablet    Sig: Take as directed.    Dispense:  1 each    Refill:  0   HYDROcodone -acetaminophen  (NORCO/VICODIN) 5-325 MG tablet    Sig: Take 1 tablet by mouth every 6 (six) hours as needed for moderate pain (pain score 4-6) or severe pain (pain score 7-10).    Dispense:  8 tablet    Refill:  0   Work/school excuse note: provided. Medication sedation precautions given. Encourage ROM/movement as tolerated.  Recommend:  Follow-up Information     Schedule an appointment as soon as possible for a visit  with [facility].   Specialty: Specialist Contact information: [address] [place] [address] [phone]                [place] Controlled Substances Registry consulted for this patient. I feel the risk/benefit ratio today is favorable for proceeding with this prescription for a controlled substance. Medication sedation precautions given.  Reviewed expectations re: course of current medical issues. Questions answered. Outlined signs and symptoms indicating need for more acute intervention. Patient verbalized understanding. After Visit Summary given.   SUBJECTIVE: History from: patient.  Philip Ramos is a 28 y.o. male who presents with complaint of intermittent R LBP with occas radiation down R leg; sharp pain. Normal ambulation. H/O similar in distant past. Lifts at work; questions relation. Denies abd pain. Denies injury/trauma. OTC analgesics without much help.   OBJECTIVE:  Vitals:   09/25/23 1221  BP: 117/75  Pulse: (!) 59  Resp: 18  Temp: 97.6 F  (36.4 C)  TempSrc: Oral  SpO2: 97%    General appearance: alert; no distress HEENT: [place]; AT Neck: supple with FROM; without midline tenderness CV: regular Lungs: unlabored respirations; speaks full sentences without difficulty Abdomen: soft, non-tender; non-distended Back: mild  and poorly localized tenderness to palpation over R lower back; FROM at waist; bruising: none; without midline tenderness Extremities: without edema; symmetrical without gross deformities; normal ROM of bilateral LE Skin: warm and dry Neurologic: normal gait; normal sensation and strength of bilateral LE Psychological: alert and cooperative; normal mood and affect   Allergies  Allergen Reactions   Amoxicillin Swelling    Did it involve swelling of the face/tongue/throat, SOB, or low BP? Y Did it involve sudden or severe rash/hives, skin peeling, or any reaction on the inside of your mouth or nose? Y Did you need to seek medical attention at a hospital or doctor's office? y When did it last happen?      more than 10 years If all above answers are "NO", may proceed with cephalosporin use.    Peanut -Containing Drug Products Swelling   Penicillins Other (See Comments)    Has patient had a PCN reaction causing immediate rash, facial/tongue/throat swelling, SOB or lightheadedness with hypotension: Y Has patient had a PCN reaction causing severe rash involving mucus membranes or skin necrosis: Y Has patient had a PCN reaction that required hospitalization: Y Has patient had a PCN reaction occurring within the last 10 years: Y If all of the above answers are NO, then may proceed with Cephalosporin  use.    Shellfish Allergy Other (See Comments)    He said that he can eat fish but can not eat shrimp    Past Medical History:  Diagnosis Date   Asthma    Eczema    Urticaria    Social History   Socioeconomic History   Marital status: Single    Spouse name: Not on file   Number of children: Not on file    Years of education: Not on file   Highest education level: Not on file  Occupational History   Not on file  Tobacco Use   Smoking status: Every Day    Current packs/day: 0.10    Types: Cigarettes, Cigars   Smokeless tobacco: Never   Tobacco comments:    smokes black and milds  Vaping Use   Vaping status: Never Used  Substance and Sexual Activity   Alcohol use: Yes    Alcohol/week: 1.0 standard drink of alcohol    Types: 1 Cans of beer per week    Comment: occ   Drug use: No   Sexual activity: Not Currently  Other Topics Concern   Not on file  Social History Narrative   Not on file   Social Drivers of Health   Financial Resource Strain: Not on file  Food Insecurity: Not on file  Transportation Needs: Not on file  Physical Activity: Not on file  Stress: Not on file  Social Connections: Not on file  Intimate Partner Violence: Not on file   Family History  Problem Relation Age of Onset   Hypertension Other    Diabetes Other    Cancer Other    Cancer Mother    Diabetes Mother    Cancer Father    Diabetes Father    History reviewed. No pertinent surgical history.    Rolinda Rogue, MD 09/25/23 [phone]

## 2023-09-25 NOTE — ED Triage Notes (Signed)
 Pt here for right lower back pain with radiation down right leg x 2 days; pt sts hx of similar

## 2024-02-26 ENCOUNTER — Ambulatory Visit
Admission: RE | Admit: 2024-02-26 | Discharge: 2024-02-26 | Disposition: A | Discharge status: Home / Self Care | Source: Ambulatory Visit | Attending: Physician Assistant | Admitting: Physician Assistant

## 2024-02-26 DIAGNOSIS — M25569 Pain in unspecified knee: Secondary | ICD-10-CM

## 2024-03-03 ENCOUNTER — Other Ambulatory Visit: Payer: Self-pay | Admitting: Physician Assistant

## 2024-03-03 DIAGNOSIS — G8929 Other chronic pain: Secondary | ICD-10-CM

## 2024-04-07 ENCOUNTER — Ambulatory Visit: Admitting: Orthopedic Surgery
